# Patient Record
Sex: Female | Born: 1970 | Race: White | Hispanic: No | Marital: Married | Smoking: Never smoker
Health system: Southern US, Community
[De-identification: ages and names within clinical notes are randomized; demographics above are authoritative.]

## PROBLEM LIST (undated history)

## (undated) DIAGNOSIS — N840 Polyp of corpus uteri: Secondary | ICD-10-CM

## (undated) DIAGNOSIS — D259 Leiomyoma of uterus, unspecified: Secondary | ICD-10-CM

## (undated) DIAGNOSIS — C73 Malignant neoplasm of thyroid gland: Secondary | ICD-10-CM

## (undated) DIAGNOSIS — C50919 Malignant neoplasm of unspecified site of unspecified female breast: Secondary | ICD-10-CM

## (undated) DIAGNOSIS — F329 Major depressive disorder, single episode, unspecified: Secondary | ICD-10-CM

## (undated) DIAGNOSIS — R32 Unspecified urinary incontinence: Secondary | ICD-10-CM

## (undated) DIAGNOSIS — F32A Depression, unspecified: Secondary | ICD-10-CM

## (undated) DIAGNOSIS — N979 Female infertility, unspecified: Secondary | ICD-10-CM

## (undated) DIAGNOSIS — Z87442 Personal history of urinary calculi: Secondary | ICD-10-CM

## (undated) DIAGNOSIS — C801 Malignant (primary) neoplasm, unspecified: Secondary | ICD-10-CM

## (undated) DIAGNOSIS — M1990 Unspecified osteoarthritis, unspecified site: Secondary | ICD-10-CM

## (undated) DIAGNOSIS — F419 Anxiety disorder, unspecified: Secondary | ICD-10-CM

## (undated) HISTORY — DX: Polyp of corpus uteri: N84.0

## (undated) HISTORY — DX: Leiomyoma of uterus, unspecified: D25.9

## (undated) HISTORY — DX: Malignant neoplasm of unspecified site of unspecified female breast: C50.919

## (undated) HISTORY — DX: Unspecified urinary incontinence: R32

## (undated) HISTORY — DX: Malignant neoplasm of thyroid gland: C73

## (undated) HISTORY — DX: Malignant (primary) neoplasm, unspecified: C80.1

## (undated) HISTORY — DX: Anxiety disorder, unspecified: F41.9

## (undated) HISTORY — DX: Female infertility, unspecified: N97.9

## (undated) HISTORY — DX: Major depressive disorder, single episode, unspecified: F32.9

## (undated) HISTORY — PX: WISDOM TOOTH EXTRACTION: SHX21

## (undated) HISTORY — DX: Depression, unspecified: F32.A

---

## 2004-01-02 ENCOUNTER — Other Ambulatory Visit: Admission: RE | Admit: 2004-01-02 | Discharge: 2004-01-02 | Payer: Self-pay | Admitting: Obstetrics and Gynecology

## 2004-06-22 ENCOUNTER — Inpatient Hospital Stay (HOSPITAL_COMMUNITY): Admission: AD | Admit: 2004-06-22 | Discharge: 2004-06-22 | Payer: Self-pay | Admitting: Obstetrics and Gynecology

## 2004-09-09 ENCOUNTER — Inpatient Hospital Stay (HOSPITAL_COMMUNITY): Admission: AD | Admit: 2004-09-09 | Discharge: 2004-09-11 | Payer: Self-pay | Admitting: Obstetrics and Gynecology

## 2004-09-14 ENCOUNTER — Encounter: Admission: RE | Admit: 2004-09-14 | Discharge: 2004-09-23 | Payer: Self-pay | Admitting: Obstetrics and Gynecology

## 2005-04-21 ENCOUNTER — Other Ambulatory Visit: Admission: RE | Admit: 2005-04-21 | Discharge: 2005-04-21 | Payer: Self-pay | Admitting: Obstetrics and Gynecology

## 2011-06-08 DIAGNOSIS — R112 Nausea with vomiting, unspecified: Secondary | ICD-10-CM

## 2011-06-08 DIAGNOSIS — Z9889 Other specified postprocedural states: Secondary | ICD-10-CM

## 2011-06-08 DIAGNOSIS — C73 Malignant neoplasm of thyroid gland: Secondary | ICD-10-CM

## 2011-06-08 HISTORY — DX: Nausea with vomiting, unspecified: R11.2

## 2011-06-08 HISTORY — PX: TOTAL THYROIDECTOMY: SHX2547

## 2011-06-08 HISTORY — DX: Malignant neoplasm of thyroid gland: C73

## 2011-06-08 HISTORY — DX: Other specified postprocedural states: Z98.890

## 2015-11-18 DIAGNOSIS — Z85850 Personal history of malignant neoplasm of thyroid: Secondary | ICD-10-CM | POA: Diagnosis not present

## 2015-11-21 DIAGNOSIS — Z85850 Personal history of malignant neoplasm of thyroid: Secondary | ICD-10-CM | POA: Diagnosis not present

## 2016-01-13 ENCOUNTER — Telehealth: Payer: Self-pay | Admitting: Obstetrics & Gynecology

## 2016-01-13 ENCOUNTER — Encounter: Payer: Self-pay | Admitting: Obstetrics & Gynecology

## 2016-01-13 ENCOUNTER — Ambulatory Visit (INDEPENDENT_AMBULATORY_CARE_PROVIDER_SITE_OTHER): Payer: BLUE CROSS/BLUE SHIELD | Admitting: Obstetrics & Gynecology

## 2016-01-13 VITALS — BP 106/70 | HR 66 | Resp 14 | Ht 69.0 in | Wt 198.0 lb

## 2016-01-13 DIAGNOSIS — Z01419 Encounter for gynecological examination (general) (routine) without abnormal findings: Secondary | ICD-10-CM

## 2016-01-13 DIAGNOSIS — Z124 Encounter for screening for malignant neoplasm of cervix: Secondary | ICD-10-CM

## 2016-01-13 DIAGNOSIS — C73 Malignant neoplasm of thyroid gland: Secondary | ICD-10-CM | POA: Diagnosis not present

## 2016-01-13 DIAGNOSIS — N951 Menopausal and female climacteric states: Secondary | ICD-10-CM | POA: Diagnosis not present

## 2016-01-13 DIAGNOSIS — Z1151 Encounter for screening for human papillomavirus (HPV): Secondary | ICD-10-CM | POA: Diagnosis not present

## 2016-01-13 DIAGNOSIS — E890 Postprocedural hypothyroidism: Secondary | ICD-10-CM

## 2016-01-13 DIAGNOSIS — N840 Polyp of corpus uteri: Secondary | ICD-10-CM

## 2016-01-13 NOTE — Telephone Encounter (Signed)
Patient would like to find out what her insurance will cover for an ablation procedure.

## 2016-01-13 NOTE — Progress Notes (Signed)
45 y.o. G1P1 Married Caucasian F here for new patient appt.  She was referred from Dr. Virgina Jock.  Pt grew up in [place] but was living in [place].  Reports over the last few months her cycles have changed.  Her flow starts as brownish discharge for a few days and then her flow starts.  Flow lasts 3 days with the first two heavy.  Cycles have been this way for a long time.  Gradually they have gotten heavier.    Pt was back in [place] visiting extended family and saw her PCP there and had ultrasound done.  This showed 93mm endometrium, normal ovaries except for a normal appearing follicle, and a 6 x 45mm polyp.  (Pt brought a copy of the DVD and I reviewed it personally.)  Pt referred, first, to see if polyp removal is needed.  Second, pt needs to have pap updated.  Last one was normal but was in 2015.  Pt was able to call office in [place] and was advised she did not have HR HPV testing done at that visit.  She signed release for me to get a copy of her records.  Lastly, pt reports she is feeling "more hormonal" over the last few months and feels "off".  She reports weight gain over the last year.  She does report the stress of moving from [place] back to [place] has been significant.  However, she is glad she is here and would like to start focusing on her health.    Hx of infertility when trying for second pregnancy.  Never was able to get pregnant.  Thyroid cancer was discovered during this time.  She has had a complete thyroidectomy with some lymph nodes removed.  On thyroid replacement.  She is followed by Dr. Virgina Jock for this.  Brought copies of recent thyroid testing which was normal.   Patient's last menstrual period was 01/02/2016 (exact date).          Sexually active: Yes.    The current method of family planning is coitus interruptus.    Exercising: Yes.    walking, swimming  Smoker:  no  Health Maintenance: Pap:  2015 normal per patient  History of abnormal Pap:  no MMG:  Fall 2016, normal per  patient, pt has copy she will bring Colonoscopy:  never BMD:   never TDaP:  unsure Pneumonia vaccine(s):  never Zostavax:   never Hep C testing: not indicated  Screening Labs: PCP- has paper copy with her, Hb today: PCP, Urine today: PCP   Past Medical History:  Diagnosis Date  . Anxiety   . Depression   . Infertility, female   . Thyroid cancer ([place]) 2013  . Urinary incontinence     Past Surgical History:  Procedure Laterality Date  . TOTAL THYROIDECTOMY  2013    Current Outpatient Prescriptions  Medication Sig Dispense Refill  . amphetamine-dextroamphetamine (ADDERALL) 5 MG tablet TK 1 T PO D  0  . escitalopram (LEXAPRO) 10 MG tablet Take 10 mg by mouth daily.    Marland Kitchen levothyroxine (SYNTHROID, LEVOTHROID) 137 MCG tablet TK 1 T PO 5 TIMES A WEEK  3  . levothyroxine (SYNTHROID, LEVOTHROID) 150 MCG tablet Take 150 mcg by mouth once a week. Thursday    . NON FORMULARY [place] 40 + vitamin     No current facility-administered medications for this visit.     History reviewed. No pertinent family history.  ROS:  Pertinent items are noted in HPI.  Otherwise,  a comprehensive ROS was negative.  Exam:   BP 106/70 (BP Location: Right Arm, Patient Position: Sitting)   Pulse 66   Resp 14   Ht 5\' 9"  (1.753 m)   Wt 198 lb (89.8 kg)   LMP 01/02/2016 (Exact Date)   BMI 29.24 kg/m     Height: 5\' 9"  (175.3 cm)  Ht Readings from Last 3 Encounters:  01/13/16 5\' 9"  (1.753 m)    General appearance: alert, cooperative and appears stated age Head: Normocephalic, without obvious abnormality, atraumatic Neck: no adenopathy, supple, symmetrical, trachea midline and thyroid normal to inspection and palpation Lungs: clear to auscultation bilaterally Breasts: normal appearance, no masses or tenderness Heart: regular rate and rhythm Abdomen: soft, non-tender; bowel sounds normal; no masses,  no organomegaly Extremities: extremities normal, atraumatic, no cyanosis or edema Skin: Skin  color, texture, turgor normal. No rashes or lesions Lymph nodes: Cervical, supraclavicular, and axillary nodes normal. No abnormal inguinal nodes palpated Neurologic: Grossly normal   Pelvic: External genitalia:  no lesions              Urethra:  normal appearing urethra with no masses, tenderness or lesions              Bartholins and Skenes: normal                 Vagina: normal appearing vagina with normal color and discharge, no lesions              Cervix: no lesions              Pap taken: Yes.   Bimanual Exam:  Uterus:  normal size, contour, position, consistency, mobility, non-tender              Adnexa: normal adnexa and no mass, fullness, tenderness               Rectovaginal: Confirms               Anus:  normal sphincter tone, no lesions  Chaperone was present for exam.  A:  Well Woman with normal exam Change in cycles with dark spotting for a few days before cycles Long standing menorrhagia during first two days of cycles 6 x 34mm endometrial polyp noted on PUS done in [place] H/O thyroidectomy due to thyroid cancer, on thyroid replacement  P:   Mammogram screening discussed.  information for starting MMG in [place] given Pap and HR HPV obtained today. Will have pt return for day 3 FSH and estradiol to assess ovarian reserve Reviewed indications for removal of polyp--size >1.5cm, risks for hyperplasia/malignancy, multiple polyps, polyp prolapsed through cervix (none of which exist in this case).  This seems to be an incidental finding.  However, treatment for her bleeding may be appropriate.  Endometrial biopsy and possible endometrial ablation with concurrent resection of polyp could be done.  Pt is concerned about cost.  Also, discussed with pt treatment with low dosed OCPs.  Progesterone options discussed as well.  Need for contraception and risks for future pregnancy if was pregnant after an ablation reviewed as well.  Pt would like to start with hormonal testing and take a  "step-wise approach".  She will call with onset of cycle for blood work.  Information on ablation given.  ~ In addition to new pt AEX, 30 minutes spent with patient in face to face discussion regarding ultrasound, indications for polyp resection, possible other treatment options for bleeding.    1

## 2016-01-14 DIAGNOSIS — E039 Hypothyroidism, unspecified: Secondary | ICD-10-CM | POA: Insufficient documentation

## 2016-01-14 DIAGNOSIS — N840 Polyp of corpus uteri: Secondary | ICD-10-CM

## 2016-01-14 DIAGNOSIS — C73 Malignant neoplasm of thyroid gland: Secondary | ICD-10-CM | POA: Insufficient documentation

## 2016-01-14 HISTORY — DX: Polyp of corpus uteri: N84.0

## 2016-01-16 LAB — IPS PAP TEST WITH HPV

## 2016-01-26 NOTE — Telephone Encounter (Signed)
Patient is calling to schedule an appointment with dr Sabra Heck but is not sure what she needs to be seen for.

## 2016-01-26 NOTE — Telephone Encounter (Signed)
Return call to patient. States she would like to schedule Day 3 blood tests she discussed with Dr Sabra Heck. Today is first day of full cycle bleeding, had some spotting prior to this but this is first day of actual bleeding. Labs scheduled for 01-28-16 as patient states she discussed with Dr Sabra Heck.  Routing to provider for final review. Patient agreeable to disposition. Will close encounter.

## 2016-01-28 ENCOUNTER — Other Ambulatory Visit (INDEPENDENT_AMBULATORY_CARE_PROVIDER_SITE_OTHER): Payer: BLUE CROSS/BLUE SHIELD

## 2016-01-28 DIAGNOSIS — N951 Menopausal and female climacteric states: Secondary | ICD-10-CM

## 2016-01-29 LAB — FOLLICLE STIMULATING HORMONE: FSH: 9.5 m[IU]/mL

## 2016-01-29 LAB — ESTRADIOL: Estradiol: 21 pg/mL

## 2016-01-30 ENCOUNTER — Encounter: Payer: Self-pay | Admitting: Obstetrics & Gynecology

## 2016-02-03 NOTE — Telephone Encounter (Signed)
Lamont Snowball, RN spoke with patient regarding results. Please see result note dated 01/28/2016.

## 2016-02-11 ENCOUNTER — Telehealth: Payer: Self-pay | Admitting: *Deleted

## 2016-02-11 MED ORDER — NORETHINDRONE ACET-ETHINYL EST 1-20 MG-MCG PO TABS: 1.0000 | ORAL_TABLET | Freq: Every day | ORAL | 3 refills | Status: DC

## 2016-02-11 NOTE — Telephone Encounter (Signed)
Reviewed lab results with Dr Sabra Heck. Dr Sabra Heck recommends 3 month trial of Lo Estrin 1/20 and then follow up visit. Patient agreeable to plan. Brief review of OCP and possible side effects. Discussed taking at same time daily and start on first day of cycle due in next week or so. Follow- up visit scheduled for 04-23-16 with Dr Sabra Heck.

## 2016-03-01 DIAGNOSIS — G8929 Other chronic pain: Secondary | ICD-10-CM | POA: Diagnosis not present

## 2016-03-01 DIAGNOSIS — M25572 Pain in left ankle and joints of left foot: Secondary | ICD-10-CM | POA: Diagnosis not present

## 2016-03-01 DIAGNOSIS — R600 Localized edema: Secondary | ICD-10-CM | POA: Diagnosis not present

## 2016-03-04 ENCOUNTER — Telehealth: Payer: Self-pay | Admitting: Obstetrics & Gynecology

## 2016-03-04 NOTE — Telephone Encounter (Signed)
Patient is having issues with her birth control and would like to speak with nurse about switching.

## 2016-03-04 NOTE — Telephone Encounter (Signed)
Spoke with patient. Patient reports she started taking  Lo Estrin 1/20, 2 weeks ago. Patient states she does not like the way she feels. Patient reports she feels like she is "going to jump out of her skin", increased aggression, "mean", "not myself" and "just don't feel myself". Patient states this is only new med she is taking. Reviewed recommendations of 3 month trial with OCP and side effects. Patient reports missing doses and not not taking at same time everyday. Patient reports "cycles have improved". Patient would like to stop the medication and switch to another. Recommended OV with Dr. Sabra Heck to discuss options, patient declined. Patient states this is a busy time for her and her work, she will keep current follow-up 04/23/16. Advised will review with Dr. Sabra Heck for recommendations and return call. Advised Dr. Sabra Heck is seeing patients, response may not be immediate. Patient is agreeable. Last AEX 01/13/16.   Dr. Sabra Heck, please advise?

## 2016-03-04 NOTE — Telephone Encounter (Signed)
Please have her stop the OCPs to make sure symptoms resolve.  Once they do, she can let us know and start Loloestrin.  This one may cost more but is the only estrogen containing pill that is lower dosed than the Loestrin 1/20.  I would have her call with onset of her next cycle to start the new pill.  She will want to get on line and print a coupon for the [facility] as it is sometimes more expensive than the first one she used.

## 2016-03-04 NOTE — Telephone Encounter (Signed)
Left detailed message on [phone] per current ROI on file. Advised as seen below per Dr. Sabra Heck. Advised patient to call with any additional questions.  Routing to provider for final review. Patient is agreeable to disposition. Will close encounter.

## 2016-04-05 ENCOUNTER — Encounter: Payer: Self-pay | Admitting: Obstetrics & Gynecology

## 2016-04-05 ENCOUNTER — Telehealth: Payer: Self-pay | Admitting: Obstetrics & Gynecology

## 2016-04-05 NOTE — Telephone Encounter (Signed)
Patient is trying to get her results and needs to speak with the nurse.

## 2016-04-05 NOTE — Telephone Encounter (Signed)
Spoke with patient. Patient states results that she had requested had already been faxed, no further assistance needed. Thankful for calling back.   Routing to provider for final review. Patient is agreeable to disposition. Will close encounter.

## 2016-04-07 DIAGNOSIS — R635 Abnormal weight gain: Secondary | ICD-10-CM | POA: Diagnosis not present

## 2016-04-07 DIAGNOSIS — E559 Vitamin D deficiency, unspecified: Secondary | ICD-10-CM | POA: Diagnosis not present

## 2016-04-07 DIAGNOSIS — F419 Anxiety disorder, unspecified: Secondary | ICD-10-CM | POA: Diagnosis not present

## 2016-04-07 DIAGNOSIS — E039 Hypothyroidism, unspecified: Secondary | ICD-10-CM | POA: Diagnosis not present

## 2016-04-07 DIAGNOSIS — F909 Attention-deficit hyperactivity disorder, unspecified type: Secondary | ICD-10-CM | POA: Diagnosis not present

## 2016-04-09 DIAGNOSIS — Z0000 Encounter for general adult medical examination without abnormal findings: Secondary | ICD-10-CM | POA: Diagnosis not present

## 2016-04-09 DIAGNOSIS — E038 Other specified hypothyroidism: Secondary | ICD-10-CM | POA: Diagnosis not present

## 2016-04-20 DIAGNOSIS — Z0000 Encounter for general adult medical examination without abnormal findings: Secondary | ICD-10-CM | POA: Diagnosis not present

## 2016-04-20 DIAGNOSIS — F900 Attention-deficit hyperactivity disorder, predominantly inattentive type: Secondary | ICD-10-CM | POA: Diagnosis not present

## 2016-04-20 DIAGNOSIS — Z6829 Body mass index (BMI) 29.0-29.9, adult: Secondary | ICD-10-CM | POA: Diagnosis not present

## 2016-04-20 DIAGNOSIS — F418 Other specified anxiety disorders: Secondary | ICD-10-CM | POA: Diagnosis not present

## 2016-04-20 DIAGNOSIS — Z1389 Encounter for screening for other disorder: Secondary | ICD-10-CM | POA: Diagnosis not present

## 2016-04-20 DIAGNOSIS — N946 Dysmenorrhea, unspecified: Secondary | ICD-10-CM | POA: Diagnosis not present

## 2016-04-20 DIAGNOSIS — N840 Polyp of corpus uteri: Secondary | ICD-10-CM | POA: Diagnosis not present

## 2016-04-23 ENCOUNTER — Ambulatory Visit (INDEPENDENT_AMBULATORY_CARE_PROVIDER_SITE_OTHER): Payer: BLUE CROSS/BLUE SHIELD | Admitting: Obstetrics & Gynecology

## 2016-04-23 ENCOUNTER — Encounter: Payer: Self-pay | Admitting: Obstetrics & Gynecology

## 2016-04-23 VITALS — BP 122/70 | HR 68 | Resp 16 | Ht 68.0 in | Wt 194.0 lb

## 2016-04-23 DIAGNOSIS — N920 Excessive and frequent menstruation with regular cycle: Secondary | ICD-10-CM | POA: Diagnosis not present

## 2016-04-23 DIAGNOSIS — Z3044 Encounter for surveillance of vaginal ring hormonal contraceptive device: Secondary | ICD-10-CM | POA: Diagnosis not present

## 2016-04-23 DIAGNOSIS — F411 Generalized anxiety disorder: Secondary | ICD-10-CM

## 2016-04-23 NOTE — Progress Notes (Signed)
GYNECOLOGY  VISIT   HPI:  45 y.o. G1P1 Married Caucasian female here for follow of bleeding and mood issues that she feels is very hormonal in nature.  Having increased anxiety that is causing some work issues.  She really does not want to be on any medication for this.  We did discuss SSRI use and not using benzodiazepines if she does decide to start treatment.  Pt feels if cycles were better, this would help.  Reports cycles are regular and have decreased in length with the last two cycles.  She is still interested in "having something done" about the bleeding because prior to the two months, she's had very heavy bleeding at times.  She did have an ultrasound with a very small polyp present.  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  She had long term infertility.  We discussed endometrial ablation but I would recommend some procedure for permanent sterilization if this were to be done.  She was considering going back to [place] to have it done but this hasn't happened.  She did go to [facility] in [place].  Was advised regarding several options which she didn't really feel comfortable with so hasn't started anything.  Really doesn't want to be on medication.  Before her visit, we discussed visit on the phone.  I requested she have a repeat [place] and estradiol at that visit.  She brings her labs with her and that was not done.  Everything else was normal.  She is considering trying Armour Thyroid vs levothyroxine.  This was suggested by integrative provider.  She wants to discuss.  My limited experience and concerns with this medication discussed.  We also discussed IUD use today for control of bleeding.  Depending on coverage, this could be quite low cost.  She is interested in "doing something" so would like additional information.  Procedure, risks and benefits reviewed.     Patient's last menstrual period was 04/02/2016.     GYNECOLOGIC  HISTORY: Patient's last menstrual period was 04/02/2016. Contraception: infertility, no active use of contraception Menopausal hormone therapy: none  Patient Active Problem List   Diagnosis Date Noted  . Endometrial polyp 01/14/2016  . Thyroid cancer ([place]) 01/14/2016  . Hypothyroidism 01/14/2016    Past Medical History:  Diagnosis Date  . Anxiety   . Depression   . Infertility, female   . Thyroid cancer ([place]) 2013  . Urinary incontinence     Past Surgical History:  Procedure Laterality Date  . TOTAL THYROIDECTOMY  2013    MEDS:  Reviewed in EPIC and UTD  ALLERGIES: Erythromycin  Family History  Problem Relation Age of Onset  . Breast cancer Mother 66    Double Mastectomy   . Breast cancer Other     maternal great aunt    SH:  Married, non smoker  Review of Systems  Psychiatric/Behavioral: The patient is nervous/anxious.   All other systems reviewed and are negative.   PHYSICAL EXAMINATION:    BP 122/70 (BP Location: Right Arm, Patient Position: Sitting, Cuff Size: Normal)   Pulse 68   Resp 16   Ht 5\' 8"  (1.727 m)   Wt 194 lb (88 kg)   LMP 04/02/2016   BMI 29.50 kg/m     General appearance: alert, cooperative and appears stated age No other exam was performed  Assessment: H/O menorrhagia that pt feels is due to "hormonal issues".  Has seen integrative medicine provider.  Small endometrial polyp Anxiety  Plan: Declined medical treatment for her anxiety at this time Desires to have more information about cost with IUD placement.  She will be contacted with this information.   ~35 minutes spent with patient with all of this in face to face discussion of above.  Lengthy visit.

## 2016-04-27 ENCOUNTER — Telehealth: Payer: Self-pay | Admitting: Obstetrics & Gynecology

## 2016-04-27 NOTE — Telephone Encounter (Signed)
Spoke with patient in regards to benefits for a Mirena IUD. Patient understood and is agreeable. Advised patient to call our office at the onset of her cycle in December, for placement. Patient is agreeable. Patient has no further questions.  Routing to Dr Sabra Heck

## 2016-05-11 DIAGNOSIS — R635 Abnormal weight gain: Secondary | ICD-10-CM | POA: Diagnosis not present

## 2016-05-11 DIAGNOSIS — Z85850 Personal history of malignant neoplasm of thyroid: Secondary | ICD-10-CM | POA: Diagnosis not present

## 2016-05-11 DIAGNOSIS — M2550 Pain in unspecified joint: Secondary | ICD-10-CM | POA: Diagnosis not present

## 2016-05-11 DIAGNOSIS — F419 Anxiety disorder, unspecified: Secondary | ICD-10-CM | POA: Diagnosis not present

## 2016-05-20 DIAGNOSIS — Z1212 Encounter for screening for malignant neoplasm of rectum: Secondary | ICD-10-CM | POA: Diagnosis not present

## 2016-07-05 ENCOUNTER — Encounter: Payer: Self-pay | Admitting: Obstetrics & Gynecology

## 2016-07-09 DIAGNOSIS — Z85850 Personal history of malignant neoplasm of thyroid: Secondary | ICD-10-CM | POA: Diagnosis not present

## 2016-07-09 DIAGNOSIS — F419 Anxiety disorder, unspecified: Secondary | ICD-10-CM | POA: Diagnosis not present

## 2016-07-09 DIAGNOSIS — R635 Abnormal weight gain: Secondary | ICD-10-CM | POA: Diagnosis not present

## 2016-07-09 DIAGNOSIS — M2550 Pain in unspecified joint: Secondary | ICD-10-CM | POA: Diagnosis not present

## 2016-08-30 DIAGNOSIS — E890 Postprocedural hypothyroidism: Secondary | ICD-10-CM | POA: Insufficient documentation

## 2016-08-30 DIAGNOSIS — Z043 Encounter for examination and observation following other accident: Secondary | ICD-10-CM | POA: Diagnosis not present

## 2016-08-30 DIAGNOSIS — Z6829 Body mass index (BMI) 29.0-29.9, adult: Secondary | ICD-10-CM | POA: Diagnosis not present

## 2016-08-30 DIAGNOSIS — F988 Other specified behavioral and emotional disorders with onset usually occurring in childhood and adolescence: Secondary | ICD-10-CM | POA: Insufficient documentation

## 2016-08-30 DIAGNOSIS — S199XXA Unspecified injury of neck, initial encounter: Secondary | ICD-10-CM | POA: Diagnosis not present

## 2016-08-30 DIAGNOSIS — S134XXA Sprain of ligaments of cervical spine, initial encounter: Secondary | ICD-10-CM | POA: Diagnosis not present

## 2016-08-30 DIAGNOSIS — S335XXA Sprain of ligaments of lumbar spine, initial encounter: Secondary | ICD-10-CM | POA: Diagnosis not present

## 2016-08-30 DIAGNOSIS — M545 Low back pain: Secondary | ICD-10-CM | POA: Diagnosis not present

## 2016-10-15 DIAGNOSIS — E039 Hypothyroidism, unspecified: Secondary | ICD-10-CM | POA: Diagnosis not present

## 2016-10-15 DIAGNOSIS — F909 Attention-deficit hyperactivity disorder, unspecified type: Secondary | ICD-10-CM | POA: Diagnosis not present

## 2016-10-15 DIAGNOSIS — F419 Anxiety disorder, unspecified: Secondary | ICD-10-CM | POA: Diagnosis not present

## 2016-10-15 DIAGNOSIS — M2550 Pain in unspecified joint: Secondary | ICD-10-CM | POA: Diagnosis not present

## 2016-10-19 DIAGNOSIS — F900 Attention-deficit hyperactivity disorder, predominantly inattentive type: Secondary | ICD-10-CM | POA: Diagnosis not present

## 2016-10-19 DIAGNOSIS — M5489 Other dorsalgia: Secondary | ICD-10-CM | POA: Diagnosis not present

## 2016-10-19 DIAGNOSIS — F418 Other specified anxiety disorders: Secondary | ICD-10-CM | POA: Diagnosis not present

## 2016-10-19 DIAGNOSIS — E663 Overweight: Secondary | ICD-10-CM | POA: Diagnosis not present

## 2016-10-29 DIAGNOSIS — M2550 Pain in unspecified joint: Secondary | ICD-10-CM | POA: Diagnosis not present

## 2016-10-29 DIAGNOSIS — F419 Anxiety disorder, unspecified: Secondary | ICD-10-CM | POA: Diagnosis not present

## 2016-10-29 DIAGNOSIS — R635 Abnormal weight gain: Secondary | ICD-10-CM | POA: Diagnosis not present

## 2016-10-29 DIAGNOSIS — Z85850 Personal history of malignant neoplasm of thyroid: Secondary | ICD-10-CM | POA: Diagnosis not present

## 2016-12-23 ENCOUNTER — Encounter: Payer: Self-pay | Admitting: Obstetrics & Gynecology

## 2016-12-28 ENCOUNTER — Telehealth: Payer: Self-pay

## 2016-12-28 NOTE — Telephone Encounter (Signed)
Routed to [facility] for review of referral to [facility].

## 2016-12-28 NOTE — Telephone Encounter (Signed)
From [name] To [name], MD Sent 12/23/2016 10:58 AM  Hello,   Just touching base . I was recently in [place], [place] where I still have health coverage. I went to the [facility] and had a followup ultrasound to check in on the polyp found a year prior. Also met with my endocronologist and determined that the Pig Thyroid I had been taking for about 2 months was making me extremely hypothroid. I immediately switched back to synthroid and am working on getting those levels back to normal. I'm searching for a good endocronologist in the [facility] area if your office has any referrals. Also am asking Dr. Virgina Jock for a referral. I have the ultrasound report and bloodwork that I will try to attach to this email or can send in a [facility] email if you can provide me an address.  Attached is the bloodwork taken last week as well as the ultrasound done and the original one done a year ago.    Renee [name], Renee [name]  [phone]   Attachments           [id number]    Responsible Party   Pool - [facility] Clinical Pool No one has taken responsibility for this message.  No actions have been taken on this message.   Dr.Miller, okay to refer this patient to [facility]?

## 2016-12-29 ENCOUNTER — Other Ambulatory Visit: Payer: Self-pay | Admitting: Obstetrics & Gynecology

## 2016-12-29 DIAGNOSIS — E039 Hypothyroidism, unspecified: Secondary | ICD-10-CM

## 2016-12-29 NOTE — Telephone Encounter (Signed)
I'd rather refer to Dr. Buddy Duty at [facility].  It's just so hard getting notes from Dr. Chalmers Cater and her office.  Referral has been entered.  Thanks.

## 2016-12-29 NOTE — Telephone Encounter (Signed)
Left message to call Kaitlyn at [phone]. 

## 2016-12-31 NOTE — Telephone Encounter (Signed)
Routing to provider for final review. Will close encounter.     

## 2017-01-04 NOTE — Telephone Encounter (Signed)
Placed call to patient to review benefits for recommended surgical procedure and to update patient on status of referral to Dr Cindra Eves office.  Advised patient I have spoken with TJ in Dr Cindra Eves office. She advised Dr Buddy Duty reviews all requested referrals, once he has reviewed, then appointment will be scheduled. TJ advised the first available appointment with Dr Buddy Duty will be the end of November. Patient states she is "ok with waiting until November".  Patient states in the interim, she will follow up with her PCP, [facility]on [facility]. She has an appointment next week for blood work.  I also reviewed benefits for recommended hysteroscopy and IUD insertion. Patient understood information presented, but states she may want to schedule an appointment to see Dr Sabra Heck, to review other options before deciding how to proceed.  Patient states she has been communicating with our Nurse Supervisor, Lamont Snowball, RN and ask if she could return a call to her. Patient states she has many questions and will make a list to discuss with the nurse, upon return call.  Routing to Lorella Nimrod, RN  cc: Dr Sabra Heck

## 2017-01-05 NOTE — Telephone Encounter (Signed)
My Chart message from patient:  Hi Marinda Elk called me back today and went over the medical benefits with me. I have several questions.    1. Would Dr. Sabra Heck recommend I do the IUD in the office or in patient?  2. I'm concerned about the cost of removing the polyp now. I'm wondering since I dont have any symptoms if we could go ahead and do the IUD now in the office and keep an eye on the polyp. If I have it checked again next year and it is still growing could I go ahead with the surgery when the IUD is already in place?  3. When was my last visit to the office and when am I due for another visit/checkup?    Thanks so much,  Water quality scientist  ----- Message -----  From: Nurse Gilles Chiquito  Sent: 01/03/2017 6:41 PM EDT  To: Last Name Sir-Noi-Vich H. Haluska  Subject: (No subject)  I can see that it has been entered and sent to his office. I will have the business office check on that appointment as well.

## 2017-01-05 NOTE — Telephone Encounter (Signed)
Call to patient regarding My Chart message. Left message to call back and ask for triage.

## 2017-01-05 NOTE — Telephone Encounter (Signed)
Ok to just make appt for pt.

## 2017-01-06 ENCOUNTER — Telehealth: Payer: Self-pay | Admitting: Obstetrics & Gynecology

## 2017-01-06 NOTE — Telephone Encounter (Signed)
Call transferred from Triage Nurse, Glorianne Manchester, RN. Patient requested to review benefit information again for recommended surgery. Reviewed benefits and answered benefit questions. Patient understood information presented. Patient is aware this is the professional benefit only . Patient asked how she could obtain benefit information for hospital services. I provided patient with the phone number to the [facility] [phone] to contact and address her question for the hospital services. Patient advises she will contact the [facility].  Routing to Dr Sabra Heck  cc: Lamont Snowball  cc: Glorianne Manchester, RN

## 2017-01-06 NOTE — Telephone Encounter (Signed)
See previous telephone encounter dated 12/28/16. Will close encounter.

## 2017-01-06 NOTE — Telephone Encounter (Signed)
Spoke with patient. Patient returned call in regards to MyChart message as seen below. Patient states polyp has grown to 10 mm, would it be ok to watch and not do surgery right now d/t cost of surgery?  Wants to make the right decision, Mirena is covered. Risk of waiting on surgery until after IUD placed?   Recommended OV with Dr. Sabra Heck for further discussion. Advised last OV 04/23/16, pap 01/13/16 with Dr. Sabra Heck. Patient is agreeable, request week of Aug 20th. Patient scheduled for OV on 01/25/17 at 2:30pm. Advised patient Dr. Sabra Heck will review, will return call with any additional recommendations.   Routing to provider for final review. Patient is agreeable to disposition. Will close encounter.

## 2017-01-06 NOTE — Telephone Encounter (Signed)
Spoke with patient, advised as seen below per Dr. Sabra Heck. Patient has additional questions regarding her history of anesthesia and reaction, inpatient vs outpatient and cost. Advised patient procedure is outpatient, home the same day. Advised patient to discuss anesthesia concerns with Dr. Sabra Heck at [facility]. Would forward call to [facility] for review of benefits again. Patient verbalizes understanding and is agreeable.  Call forwarded to [facility]  Routing to provider for final review. Patient is agreeable to disposition. Will close encounter.

## 2017-01-06 NOTE — Telephone Encounter (Signed)
Will you let her know typically with IUD and a polyp in place, there is continued spotting which women find really annoying.  Also, the IUD has to be removed to remove the polyp, so if that is done later, then one needs a new IUD placed.  Typically, the polyp is removed and then the IUD is placed.  Can be done at the same time if can get the IUD covered through specialty pharmacy to take to the OR with [place] day of procedure.  Will review with pt at [facility] as well.  Thanks.

## 2017-01-07 ENCOUNTER — Telehealth: Payer: Self-pay | Admitting: Endocrinology

## 2017-01-07 NOTE — Telephone Encounter (Signed)
Routing to you °

## 2017-01-07 NOTE — Telephone Encounter (Signed)
[facility] calling in reference to patient needing an appointment for hypothyroidism. [facility] stated they sent records in on 12/23/16 and said it was in review on 12/27/16 Please call and advise.

## 2017-01-10 NOTE — Telephone Encounter (Signed)
Can you look into this one? I do not remember seeing this information and not sure where the patient notes packet is with her information. Thanks!

## 2017-01-10 NOTE — Telephone Encounter (Signed)
I do not have any pending records

## 2017-01-14 DIAGNOSIS — E039 Hypothyroidism, unspecified: Secondary | ICD-10-CM | POA: Diagnosis not present

## 2017-01-14 DIAGNOSIS — E038 Other specified hypothyroidism: Secondary | ICD-10-CM | POA: Diagnosis not present

## 2017-01-14 NOTE — Telephone Encounter (Signed)
Kenney Houseman from [facility] called in reference to faxing records. I informed her of notes below. [employer] fax number [phone].

## 2017-01-14 NOTE — Telephone Encounter (Signed)
Papers are coming thru the fax now

## 2017-01-25 ENCOUNTER — Encounter: Payer: Self-pay | Admitting: Obstetrics & Gynecology

## 2017-01-25 ENCOUNTER — Ambulatory Visit (INDEPENDENT_AMBULATORY_CARE_PROVIDER_SITE_OTHER): Payer: BLUE CROSS/BLUE SHIELD | Admitting: Obstetrics & Gynecology

## 2017-01-25 VITALS — BP 108/70 | HR 70 | Resp 14 | Ht 68.5 in | Wt 201.0 lb

## 2017-01-25 DIAGNOSIS — N920 Excessive and frequent menstruation with regular cycle: Secondary | ICD-10-CM

## 2017-01-25 DIAGNOSIS — N840 Polyp of corpus uteri: Secondary | ICD-10-CM

## 2017-01-25 NOTE — Progress Notes (Signed)
GYNECOLOGY  VISIT   HPI: 46 y.o. G1P1 Married Caucasian female here for discussion of enlarging endometrial polyp.  She has ultrasound done in [place] and sent report to me.  Polyp is now >1cm.  She has menorrhagia and also would like IUD placement.  Have previously recommended hysteroscopy with polyp resection.  Pt is wondering what will happen if she just has the Mirena IUD placed.  Advised she will likely have prolonged spotting.  That has been my experience with other women and IUD placement when a polyp or fibroid is present.  Advised we could proceed but that would not be my recommendation.  She felt this way too but states she just needed it confirmed.  Is going to try and do the procedure in [place] and then return for IUD placement.  Procedure was reviewed as well as risks, benefits, and recovery.  Questions answered.    GYNECOLOGIC HISTORY: Patient's last menstrual period was 01/20/2017. Contraception: withdrawal method Menopausal hormone therapy: none  Patient Active Problem List   Diagnosis Date Noted  . Endometrial polyp 01/14/2016  . Thyroid cancer ([place]) 01/14/2016  . Hypothyroidism 01/14/2016    Past Medical History:  Diagnosis Date  . Anxiety   . Depression   . Infertility, female   . Thyroid cancer ([place]) 2013  . Urinary incontinence     Past Surgical History:  Procedure Laterality Date  . TOTAL THYROIDECTOMY  2013    MEDS:   Current Outpatient Prescriptions on File Prior to Visit  Medication Sig Dispense Refill  . amphetamine-dextroamphetamine (ADDERALL) 5 MG tablet TK 1 T PO D  0  . levothyroxine (SYNTHROID, LEVOTHROID) 100 MCG tablet Take 1 tablet by mouth daily.  5  . NON FORMULARY [place] 40 + vitamin     No current facility-administered medications on file prior to visit.     ALLERGIES: Erythromycin  Family History  Problem Relation Age of Onset  . Breast cancer Mother 17       Double Mastectomy   . Breast cancer Other        maternal great aunt     SH:  Married, non smoker  Review of Systems  All other systems reviewed and are negative.   PHYSICAL EXAMINATION:    BP 108/70 (BP Location: Right Arm, Patient Position: Sitting, Cuff Size: Normal)   Pulse 70   Resp 14   Ht 5' 8.5" (1.74 m)   Wt 201 lb (91.2 kg)   LMP 01/20/2017   BMI 30.12 kg/m     General appearance: alert, cooperative and appears stated age  Assessment: Endometrial polyp Menorrhagia  Plan: Hysteroscopy with polyp resection, D&C is recommended.  Then can proceed with IUD placement (or in OR if pt preferred) but she is going to do the surgical procedure in [place] and then return here for the IUD placement.   ~15 minutes spent with patient >50% of time was in face to face discussion of above.

## 2017-01-26 DIAGNOSIS — H16223 Keratoconjunctivitis sicca, not specified as Sjogren's, bilateral: Secondary | ICD-10-CM | POA: Diagnosis not present

## 2017-01-26 DIAGNOSIS — H52203 Unspecified astigmatism, bilateral: Secondary | ICD-10-CM | POA: Diagnosis not present

## 2017-01-26 DIAGNOSIS — H524 Presbyopia: Secondary | ICD-10-CM | POA: Diagnosis not present

## 2017-02-04 ENCOUNTER — Telehealth: Payer: Self-pay | Admitting: Endocrinology

## 2017-02-04 NOTE — Telephone Encounter (Signed)
-----   Message from Elayne Snare, MD sent at 01/20/2017 11:57 AM EDT ----- Regarding: Records I have not seen any records coming in that were supposed to be faxed

## 2017-02-04 NOTE — Telephone Encounter (Signed)
Pts records were received on 8/10; per the conversation the pt had with Mardene Celeste she did not want to make an appt right now and asked for [place] to hold off until she called [place] back

## 2017-02-25 ENCOUNTER — Encounter: Payer: Self-pay | Admitting: Obstetrics & Gynecology

## 2017-03-10 ENCOUNTER — Telehealth: Payer: Self-pay | Admitting: Obstetrics & Gynecology

## 2017-03-10 NOTE — Telephone Encounter (Signed)
Advised of office recommendations for polyp removal as seen below from [place]. Patient verbalizes understanding and will call their offices to get more information.  Routing to provider for final review. Patient agreeable to disposition. Will close encounter.

## 2017-03-10 NOTE — Telephone Encounter (Signed)
Left message to call [facility] at [phone].  [phone]

## 2017-03-10 NOTE — Telephone Encounter (Signed)
Patient has a polyp that needs removed.  She would like to know the name of an office here in [place] that can do this for her.

## 2017-03-10 NOTE — Telephone Encounter (Signed)
[name], MD  to Renee [name]      10:10 PM  [facility] and [facility] do these in their office. Others might as well but I know for sure both of these do.    Renee [name]    This MyChart message has not been read.  Renee [name]  to [name], MD     8:14 AM  No problem. Yes Please that would be helpful.   Have a great weekend.  Renee [name]   February 26, 2017  [name], MD  to Renee [name]      12:06 AM  Renee [name],  I have contacted two offices that do this but they will not give me a quote. If you desire, I can give you information about two possible options and you can reach out to see since you will be the person having it done and it is your insurance. Sorry to not be more helpful.   Renee [name]    Last read by Renee [name] at 8:14 AM on 02/27/2017.  February 25, 2017  Renee [name]  to [name], MD     5:47 PM  Hello,  Just wanted to touch base about my polyp. I'm trying to get my Dr. in [place] to get a referral in [place] but am not sure it is going to happen. Dr. Sabra Heck mentioned there is an office in [place] that does the procedure in the office. Would it be possible to find out what the cost for this would be using my insurance.  Just trying to weigh out my options  I'm traveling on business next week but would be available through messaging.   Have a great weekend,  Renee [name]

## 2017-04-18 DIAGNOSIS — Z0000 Encounter for general adult medical examination without abnormal findings: Secondary | ICD-10-CM | POA: Diagnosis not present

## 2017-04-18 DIAGNOSIS — E038 Other specified hypothyroidism: Secondary | ICD-10-CM | POA: Diagnosis not present

## 2017-04-22 DIAGNOSIS — Z0000 Encounter for general adult medical examination without abnormal findings: Secondary | ICD-10-CM | POA: Diagnosis not present

## 2017-04-22 DIAGNOSIS — N946 Dysmenorrhea, unspecified: Secondary | ICD-10-CM | POA: Diagnosis not present

## 2017-04-22 DIAGNOSIS — F418 Other specified anxiety disorders: Secondary | ICD-10-CM | POA: Diagnosis not present

## 2017-04-22 DIAGNOSIS — M549 Dorsalgia, unspecified: Secondary | ICD-10-CM | POA: Diagnosis not present

## 2017-04-22 DIAGNOSIS — F900 Attention-deficit hyperactivity disorder, predominantly inattentive type: Secondary | ICD-10-CM | POA: Diagnosis not present

## 2017-04-22 DIAGNOSIS — Z1389 Encounter for screening for other disorder: Secondary | ICD-10-CM | POA: Diagnosis not present

## 2017-04-25 ENCOUNTER — Ambulatory Visit: Payer: BLUE CROSS/BLUE SHIELD | Admitting: Obstetrics & Gynecology

## 2017-04-25 NOTE — Progress Notes (Deleted)
46 y.o. G1P1 MarriedCaucasianF here for annual exam.    No LMP recorded.          Sexually active: {yes no:314532}  The current method of family planning is {contraception:315051}.    Exercising: {yes no:314532}  {types:19826} Smoker:  no  Health Maintenance: Pap:  01/13/16 Pap and HR HPV negative History of abnormal Pap:  no MMG:  *** Colonoscopy:  never BMD:   never TDaP:  unsure Pneumonia vaccine(s):  never Zostavax:   never Hep C testing: not indicated Screening Labs: ***, Hb today: ***, Urine today: ***   reports that  has never smoked. she has never used smokeless tobacco. She reports that she drinks alcohol. She reports that she does not use drugs.  Past Medical History:  Diagnosis Date  . Anxiety   . Depression   . Infertility, female   . Thyroid cancer ([place]) 2013  . Urinary incontinence     Past Surgical History:  Procedure Laterality Date  . TOTAL THYROIDECTOMY  2013    Current Outpatient Medications  Medication Sig Dispense Refill  . amphetamine-dextroamphetamine (ADDERALL) 5 MG tablet TK 1 T PO D  0  . levothyroxine (SYNTHROID, LEVOTHROID) 100 MCG tablet Take 1 tablet by mouth daily.  5  . levothyroxine (SYNTHROID, LEVOTHROID) 137 MCG tablet Take by mouth.    . NON FORMULARY [place] 40 + vitamin    . NON FORMULARY daily. Pregnenolone capsule    . TRINTELLIX 10 MG TABS TK 1 T PO D  3   No current facility-administered medications for this visit.     Family History  Problem Relation Age of Onset  . Breast cancer Mother 59       Double Mastectomy   . Breast cancer Other        maternal great aunt    ROS:  Pertinent items are noted in HPI.  Otherwise, a comprehensive ROS was negative.  Exam:   There were no vitals taken for this visit.  Weight change: @WEIGHTCHANGE @ Height:      Ht Readings from Last 3 Encounters:  01/25/17 5' 8.5" (1.74 m)  04/23/16 5\' 8"  (1.727 m)  01/13/16 5\' 9"  (1.753 m)    General appearance: alert, cooperative and  appears stated age Head: Normocephalic, without obvious abnormality, atraumatic Neck: no adenopathy, supple, symmetrical, trachea midline and thyroid {EXAM; THYROID:18604} Lungs: clear to auscultation bilaterally Breasts: {Exam; breast:13139::"normal appearance, no masses or tenderness"} Heart: regular rate and rhythm Abdomen: soft, non-tender; bowel sounds normal; no masses,  no organomegaly Extremities: extremities normal, atraumatic, no cyanosis or edema Skin: Skin color, texture, turgor normal. No rashes or lesions Lymph nodes: Cervical, supraclavicular, and axillary nodes normal. No abnormal inguinal nodes palpated Neurologic: Grossly normal   Pelvic: External genitalia:  no lesions              Urethra:  normal appearing urethra with no masses, tenderness or lesions              Bartholins and Skenes: normal                 Vagina: normal appearing vagina with normal color and discharge, no lesions              Cervix: {exam; cervix:14595}              Pap taken: {yes no:314532} Bimanual Exam:  Uterus:  {exam; uterus:12215}              Adnexa: {exam; adnexa:12223}  Rectovaginal: Confirms               Anus:  normal sphincter tone, no lesions  Chaperone was present for exam.  A:  Well Woman with normal exam  P:   {plan; gyn:5269::"mammogram","pap smear","return annually or prn"}

## 2017-04-29 DIAGNOSIS — Z1212 Encounter for screening for malignant neoplasm of rectum: Secondary | ICD-10-CM | POA: Diagnosis not present

## 2017-05-02 DIAGNOSIS — E039 Hypothyroidism, unspecified: Secondary | ICD-10-CM | POA: Diagnosis not present

## 2017-05-02 DIAGNOSIS — Z85850 Personal history of malignant neoplasm of thyroid: Secondary | ICD-10-CM | POA: Diagnosis not present

## 2017-05-02 DIAGNOSIS — Z803 Family history of malignant neoplasm of breast: Secondary | ICD-10-CM | POA: Diagnosis not present

## 2017-05-10 ENCOUNTER — Ambulatory Visit: Payer: BLUE CROSS/BLUE SHIELD | Admitting: Obstetrics & Gynecology

## 2017-05-10 ENCOUNTER — Encounter: Payer: Self-pay | Admitting: Obstetrics & Gynecology

## 2017-05-10 VITALS — BP 122/76 | HR 84 | Ht 68.5 in | Wt 199.0 lb

## 2017-05-10 DIAGNOSIS — Z01419 Encounter for gynecological examination (general) (routine) without abnormal findings: Secondary | ICD-10-CM | POA: Diagnosis not present

## 2017-05-10 NOTE — Progress Notes (Signed)
46 y.o. G1P1 MarriedCaucasianF here for annual exam.  Patient is not really keeping up with her cycles.  She had a recent cycle that was about two weeks late.  When it started, it was dark and old in appearance.  Bleeding was never heavy.  Has been having this dark, old spotting that has been increasing.  Has known polyps.  Removal has been recommended.  She has declined due to cost.   Going to [place] after the holidays.  She is going to see if she had the hysteroscopy scheduled.  Had ultrasound this summer.  Had a second polyp noted on ultrasound this summer.  I have discussed her doing this with a ob/gyn practice in [place] that can do this in office for cost reduction.  Patient's last menstrual period was 04/21/2017.          Sexually active: Yes.    The current method of family planning is coitus interruptus.    Exercising: Yes.    walking, yoga Smoker:  no  Health Maintenance: Pap:  01/13/16 Neg. HR HPV:neg  History of abnormal Pap:  no MMG: 2016 normal- [place]  TDaP:  ~4 years ago Screening Labs: PCP   reports that  has never smoked. she has never used smokeless tobacco. She reports that she drinks alcohol. She reports that she does not use drugs.  Past Medical History:  Diagnosis Date  . Anxiety   . Depression   . Infertility, female   . Thyroid cancer ([place]) 2013  . Urinary incontinence     Past Surgical History:  Procedure Laterality Date  . TOTAL THYROIDECTOMY  2013    Current Outpatient Medications  Medication Sig Dispense Refill  . amphetamine-dextroamphetamine (ADDERALL) 5 MG tablet TK 1 T PO D  0  . levothyroxine (SYNTHROID, LEVOTHROID) 100 MCG tablet Take 1 tablet by mouth daily.  5  . levothyroxine (SYNTHROID, LEVOTHROID) 125 MCG tablet Take 1 tablet by mouth daily.  3  . NON FORMULARY [place] 40 + vitamin    . NON FORMULARY daily. Pregnenolone capsule    . Pregnenolone Micronized POWD by Misc.(Non-Drug; Combo Route) route.     No current facility-administered  medications for this visit.     Family History  Problem Relation Age of Onset  . Breast cancer Mother 78       Double Mastectomy   . Breast cancer Other        maternal great aunt    ROS:  Pertinent items are noted in HPI.  Otherwise, a comprehensive ROS was negative.  Exam:   BP 122/76 (BP Location: Right Arm, Patient Position: Sitting, Cuff Size: Normal)   Pulse 84   Ht 5' 8.5" (1.74 m)   Wt 199 lb (90.3 kg)   LMP 04/21/2017   HC 14" (35.6 cm)   BMI 29.82 kg/m     Height: 5' 8.5" (174 cm)  Ht Readings from Last 3 Encounters:  05/10/17 5' 8.5" (1.74 m)  01/25/17 5' 8.5" (1.74 m)  04/23/16 5\' 8"  (1.727 m)    General appearance: alert, cooperative and appears stated age Head: Normocephalic, without obvious abnormality, atraumatic Neck: no adenopathy, supple, symmetrical, trachea midline and thyroid normal to inspection and palpation Lungs: clear to auscultation bilaterally Breasts: normal appearance, no masses or tenderness Heart: regular rate and rhythm Abdomen: soft, non-tender; bowel sounds normal; no masses,  no organomegaly Extremities: extremities normal, atraumatic, no cyanosis or edema Skin: Skin color, texture, turgor normal. No rashes or lesions Lymph nodes:  Cervical, supraclavicular, and axillary nodes normal. No abnormal inguinal nodes palpated Neurologic: Grossly normal   Pelvic: External genitalia:  no lesions              Urethra:  normal appearing urethra with no masses, tenderness or lesions              Bartholins and Skenes: normal                 Vagina: normal appearing vagina with normal color and discharge, no lesions              Cervix: no lesions              Pap taken: No. Bimanual Exam:  Uterus:  normal size, contour, position, consistency, mobility, non-tender              Adnexa: normal adnexa and no mass, fullness, tenderness               Rectovaginal: Confirms               Anus:  normal sphincter tone, no lesions  Chaperone was  present for exam.  A:  Well Woman with normal exam H/o endometrial polyp Hypothyroidism after thyroidectomy due to thyroid cancer Anxiety  P:   Mammogram guidelines reviewed.  Recommended doing locally.  Information provided.  She does not want my office to schedule this for her today. Pap smear and HR HPV neg 2017.  No pap smear obtained today. Lab work done with Dr. Buddy Duty this year Planning on having hysteroscopy done in [place] or locally for polyp removal. Return annually or prn

## 2017-05-12 ENCOUNTER — Telehealth: Payer: Self-pay | Admitting: Obstetrics & Gynecology

## 2017-05-18 ENCOUNTER — Encounter: Payer: Self-pay | Admitting: Obstetrics & Gynecology

## 2017-05-18 DIAGNOSIS — D485 Neoplasm of uncertain behavior of skin: Secondary | ICD-10-CM | POA: Diagnosis not present

## 2017-05-18 DIAGNOSIS — D229 Melanocytic nevi, unspecified: Secondary | ICD-10-CM | POA: Diagnosis not present

## 2017-05-18 DIAGNOSIS — D1801 Hemangioma of skin and subcutaneous tissue: Secondary | ICD-10-CM | POA: Diagnosis not present

## 2017-05-18 DIAGNOSIS — D239 Other benign neoplasm of skin, unspecified: Secondary | ICD-10-CM | POA: Diagnosis not present

## 2017-06-19 ENCOUNTER — Encounter: Payer: Self-pay | Admitting: Obstetrics & Gynecology

## 2017-06-20 ENCOUNTER — Telehealth: Payer: Self-pay | Admitting: Obstetrics & Gynecology

## 2017-06-20 ENCOUNTER — Encounter: Payer: Self-pay | Admitting: Obstetrics & Gynecology

## 2017-06-20 NOTE — Telephone Encounter (Signed)
Patient sent the following message through [place]. Routing to triage to assist patient with request.  ----- Message from [place], Generic sent at 06/19/2017 8:44 PM EST -----    Hi Dr. Sabra Heck,  Renee [name]. Hope you are doing well. Just wanted to touch base and update you on the visit to [facility]- with OBGYN there this past Friday. The Dr. agreed that the polyp should be removed. She told me the options of doing the Mirena while removing as well as an ablation. She reiterated that the ablation wouldn't help with pain and may need to be repeated in the future. She went ahead that day and did a biopsy and I will check back in with her office in 2 weeks to follow up. I'm looking at March or April surgery date as there is a bit of a wait. I'm OK with that and am thankful to be having it done up there.  There is a physical assesment that is very general that has to be filled out and faxed to their office. I'm hoping you could fill this out for me using the info from my last appointment and give a date of this week. If I need to come back in I will do that as well. Until the paper is faxed I cannot get a surgery appointment. see attached.Thanks!

## 2017-06-20 NOTE — Telephone Encounter (Signed)
Routing to [place] for review and advise regarding completion of form.

## 2017-06-20 NOTE — Telephone Encounter (Signed)
Patient sent in a follow up message through [place]. Routing to triage.  ----- Message from [place], Generic sent at 06/20/2017 11:54 AM EST -----    Hi Dr. Sabra Heck,    The Dr. in [place] also prescribed two medications that I would like to have filled in the [place] if possible. Would you be able to write  these prescriptions for me so that I could take with me to be done in [place]? Please see attached. Would the nurses there be able to verify that these would be covered under my insurance with [employer]?    Thanks,  [employer]

## 2017-06-20 NOTE — Telephone Encounter (Signed)
Per review of attachment in Mychart message it appears rx was written for Mirena IUD and Misoprostol 400 mcg pv night before procedure and morning of. Order to [place] for review.

## 2017-06-22 ENCOUNTER — Encounter: Payer: Self-pay | Admitting: Obstetrics & Gynecology

## 2017-06-22 NOTE — Telephone Encounter (Signed)
Routing to [place] and we discussed communicating with pt that I could do misoprostol RX but that I, personally, would not put in an IUD that came from another country.  In her case, this will go towards her deductible as she is using this for bleeding so would likely advise getting it in [place].  I can do the health form based on my last exam but I cannot do the anesthesia assessment portion of the exam form.  When it is done, does she want to come pick it up?  Thanks.

## 2017-06-22 NOTE — Telephone Encounter (Signed)
Call to patient. Detailed discussion regarding requested prescriptions as reviewed with Dr Sabra Heck.  Patient requests to check speciality pharmacy benefits for IUD and will consider having IUD inserted here after polypectomy, if has coverage.  Will send check benefits and call patient back. She is aware this may take several days.

## 2017-06-23 ENCOUNTER — Telehealth: Payer: Self-pay | Admitting: Obstetrics & Gynecology

## 2017-06-23 ENCOUNTER — Other Ambulatory Visit: Payer: Self-pay | Admitting: Obstetrics & Gynecology

## 2017-06-23 MED ORDER — MISOPROSTOL 200 MCG PO TABS: ORAL_TABLET | ORAL | 0 refills | Status: DC

## 2017-06-23 NOTE — Telephone Encounter (Signed)
Health form 06/23/2017 8:22 AM    To: Eyvonne H Enwright "Last Name Sir-Noi-Vich"    From: SpragueHarley Hallmark, RN    Created: 06/23/2017 8:22 AM     Nira Conn,  Dr.Miller has reviewed your health form and can complete the form based on your last exam, but she cannot do the anesthesia assessment portion of the exam form.When it is done, do you want to come pick it up?  Reesa Chew, RN

## 2017-06-23 NOTE — Telephone Encounter (Signed)
Message   ----- Message from [place], Generic sent at 06/22/2017 5:40 PM EST -----    Hello,    When I spoke to the nurse today I forgot to ask if your office would be able to fill out the physical report I had sent previously.  Please let me know if it wasn't received- It would need to be faxed to number on the sheet. I was in recently so was hoping that information would work. If not, I can make an appointment to come back in asap to have this completed. They cant book the surgery in [place] until this is completed.     Thanks,  Renee [name]   Form sent to Dr.Miller to review for completion.

## 2017-06-23 NOTE — Telephone Encounter (Signed)
Patient sent the following reply through MyChart:  ----- Message from [place], Generic sent at 06/23/2017 9:07 AM EST -----    Thanks so much!!!  ----- Message -----  From: Nurse Naaman Plummer  Sent: 06/23/2017 9:06 AM EST  To: Jonne Ply Broder  Subject: Health Form Complete  Jalie,    Dr.Miller has completed your health form with all of the information that she is able to provide based on what is on the form. There are some parts of the form that she is unable to complete and does not feel comfortable completing as she does not perform clearance assessments for anesthesiology. The form has been faxed to the number provided.    Reesa Chew, RN

## 2017-06-23 NOTE — Telephone Encounter (Signed)
Health Form Complete 06/23/2017 9:06 AM    To: [place]ila H Hitt "Last Name [place]"    From: SpragueHarley Hallmark, RN    Created: 06/23/2017 9:06 AM     Nira Conn,  Dr.Miller has completed your health form with all of the information that she is able to provide based on what is on the form. There are some parts of the form that she is unable to complete and does not feel comfortable completing as she does not perform clearance assessments for anesthesiology. The form has been faxed to the number provided.  Reesa Chew, RN     Routing to provider for final review. Patient agreeable to disposition. Will close encounter.

## 2017-06-23 NOTE — Telephone Encounter (Signed)
Message   ----- Message from [place], Generic sent at 06/22/2017 5:46 PM EST -----    Here is the medical form that I need filled out. It would need to be dated Jan 14 or later.     Thanks,  [name]   Form in [employer]

## 2017-06-29 ENCOUNTER — Encounter: Payer: Self-pay | Admitting: Obstetrics & Gynecology

## 2017-06-29 ENCOUNTER — Telehealth: Payer: Self-pay | Admitting: *Deleted

## 2017-06-29 NOTE — Telephone Encounter (Signed)
-----   Message from [place], Generic sent at 06/29/2017 2:40 PM EST -----    Hello-  Just following up to see if someone could please call [facility] to see if the [place] device would be covered under my insurance. If it is I will wait and have this done in [place] not in [place] as I don't have insurance for the device there.   I'm speaking with the [facility] Dr. on Friday and would like to have this information prior.    Thanks so much,  [employer]  [phone]

## 2017-06-30 NOTE — Telephone Encounter (Signed)
Routing to provider for final review. Patient agreeable to disposition. Will close encounter.     

## 2017-06-30 NOTE — Telephone Encounter (Signed)
Spoke with patient regarding benefits for a Mirena IUD device. Patient understood information presented is valid through the end of her insurance policy year, 1/61/09. Patient advises insurance should be the same after 07/08/17, but will call with new insurance information to pre-certify under the new policy year. Patient states she will look at proceeding with scheduling appointment for IUD insertion, after she has completed surgery in [place], which will probably be in March or April. Will close encounter.   cc: Dr Sabra Heck  cc: Lamont Snowball

## 2017-06-30 NOTE — Telephone Encounter (Signed)
See next encounter regarding IUD benefits and plan for procedure.  Encounter closed.

## 2017-07-11 ENCOUNTER — Encounter: Payer: Self-pay | Admitting: Obstetrics & Gynecology

## 2017-10-13 HISTORY — PX: HYSTEROSCOPY WITH D & C: SHX1775

## 2017-10-24 DIAGNOSIS — M79671 Pain in right foot: Secondary | ICD-10-CM | POA: Diagnosis not present

## 2017-10-24 DIAGNOSIS — F418 Other specified anxiety disorders: Secondary | ICD-10-CM | POA: Diagnosis not present

## 2017-10-24 DIAGNOSIS — M549 Dorsalgia, unspecified: Secondary | ICD-10-CM | POA: Diagnosis not present

## 2017-10-24 DIAGNOSIS — Z0000 Encounter for general adult medical examination without abnormal findings: Secondary | ICD-10-CM | POA: Diagnosis not present

## 2017-10-24 DIAGNOSIS — F900 Attention-deficit hyperactivity disorder, predominantly inattentive type: Secondary | ICD-10-CM | POA: Diagnosis not present

## 2017-12-26 ENCOUNTER — Encounter: Payer: Self-pay | Admitting: Obstetrics & Gynecology

## 2017-12-28 ENCOUNTER — Telehealth: Payer: Self-pay | Admitting: *Deleted

## 2017-12-28 NOTE — Telephone Encounter (Signed)
My Chart message from patient:  ----- Message from [place], Generic sent at 12/26/2017 1:19 PM EDT -----    Hello,    Just wanted to advise that I had the polyp removed in [place]. It was completely fine and I'm attaching pathology report for your records. The doctor there suggested I consider birth control pills to help with the up and down emotions that come for a week of the month. Just wondering if Dr. Sabra Heck thinks this would be a good idea. My periods are back to normal and I'm not having cramping ect. Just severe mood swings during the week prior to period.  Hope you are all having a wonderful summer. Thanks, [employer]

## 2017-12-28 NOTE — Telephone Encounter (Signed)
Call to patient. States she had surgery and denied any post-op problems.  Advised office visit recommended to review symptoms since then and determine options for management. Appointment scheduled for 01-10-18. Patient declined earlier appointment due to vacation plans.   Routing to provider for review. Will close encounter.

## 2017-12-28 NOTE — Telephone Encounter (Signed)
See phone encounter.

## 2018-01-10 ENCOUNTER — Encounter: Payer: Self-pay | Admitting: Obstetrics & Gynecology

## 2018-01-10 ENCOUNTER — Ambulatory Visit: Payer: BLUE CROSS/BLUE SHIELD | Admitting: Obstetrics & Gynecology

## 2018-01-10 VITALS — BP 100/66 | HR 64 | Resp 16 | Ht 68.5 in | Wt 203.4 lb

## 2018-01-10 DIAGNOSIS — R4586 Emotional lability: Secondary | ICD-10-CM | POA: Diagnosis not present

## 2018-01-10 DIAGNOSIS — N920 Excessive and frequent menstruation with regular cycle: Secondary | ICD-10-CM

## 2018-01-10 MED ORDER — NORETHIN ACE-ETH ESTRAD-FE 1-20 MG-MCG PO TABS: 1.0000 | ORAL_TABLET | Freq: Every day | ORAL | 3 refills | Status: DC

## 2018-01-10 MED ORDER — FLUOXETINE HCL 10 MG PO CAPS: 10.0000 mg | ORAL_CAPSULE | Freq: Every day | ORAL | 3 refills | Status: DC

## 2018-01-10 NOTE — Progress Notes (Signed)
GYNECOLOGY  VISIT  CC:   Follow-up after hysteroscopy and polyp resection, D&C  HPI: 47 y.o. G1P1 Married Caucasian female here for follow-up since having hysteroscopy and polyp resection, D&C.  Brought copy of benign pathology that will be scanned into her chart.  Cycles have been more regular since the hysteroscopy.  Cycles are 21 days.  Flow lasts 6 days.  At least two days are heavy.  Needs to change products every 3 hours.  Cramping has improved as well.    Started Prozac about a month ago.  Is on 20mg  daily.  Dr. Virgina Jock started this for her.  Discussed with pt Prozac's use with PMDD.  She has questions about dosage which were answered.    Reports she continues to have cyclical mood changes that feel very related to her cycle.  The prozac hasn't really helped this.  Feels this is worsening gradually over time and is interfering with life.  Would like to consider other options.  Provider in [place] suggested [place].  Pt used these years ago and did not have any issues with them.  Risks discussed with pt in detail including DUB, DVT/PE, headache, nausea, increased BP.  Aware if has any issues/concerns after starting then should call.  GYNECOLOGIC HISTORY: Patient's last menstrual period was 01/07/2018. Contraception: none  Patient Active Problem List   Diagnosis Date Noted  . Attention deficit disorder (ADD) in adult 08/30/2016  . Post-surgical hypothyroidism 08/30/2016  . Endometrial polyp 01/14/2016  . Thyroid cancer ([place]) 01/14/2016  . Hypothyroidism 01/14/2016    Past Medical History:  Diagnosis Date  . Anxiety   . Depression   . Fibroid, uterine   . Infertility, female   . Thyroid cancer ([place]) 2013  . Urinary incontinence     Past Surgical History:  Procedure Laterality Date  . TOTAL THYROIDECTOMY  2013  . UTERINE FIBROID SURGERY  10/13/2017    MEDS:   Current Outpatient Medications on File Prior to Visit  Medication Sig Dispense Refill  . amphetamine-dextroamphetamine  (ADDERALL) 5 MG tablet TK 1 T PO D  0  . FLUoxetine (PROZAC) 20 MG capsule Take 1 capsule by mouth daily.  4  . levothyroxine (SYNTHROID, LEVOTHROID) 125 MCG tablet Take 1 tablet by mouth daily. For 2 days a week  3  . levothyroxine (SYNTHROID, LEVOTHROID) 137 MCG tablet Take 1 tablet by mouth daily. For 5 days a week  5  . NON FORMULARY [place] 40 + vitamin    . NON FORMULARY daily. Pregnenolone capsule     No current facility-administered medications on file prior to visit.     ALLERGIES: Erythromycin  Family History  Problem Relation Age of Onset  . Breast cancer Mother 35       Double Mastectomy   . Breast cancer Other        maternal great aunt    SH:  Married, non smoker  Review of Systems  Psychiatric/Behavioral:       Mood changes  All other systems reviewed and are negative.   PHYSICAL EXAMINATION:    BP 100/66 (BP Location: Right Arm, Patient Position: Sitting, Cuff Size: Large)   Pulse 64   Resp 16   Ht 5' 8.5" (1.74 m)   Wt 203 lb 6.4 oz (92.3 kg)   LMP 01/07/2018   BMI 30.48 kg/m     General appearance: alert, cooperative and appears stated age No other exam performed  Assessment: 21 day cycles, polymenorrhea Mood changes  Plan: Will  start Loestrin 1/20 daily.  rx to pharmacy.  Continuous active use discussed.  Would like her to use these with a placebo week for at least two months before deciding to start continuous active pills.  Has BP cuff at home so will check this after being on pills for about 3 weeks.  Knows to call with any new side effects.  Also, advised to call if BP 130/90. AEX scheduled 06/2018.   ~20 minutes spent with patient >50% of time was in face to face discussion of above.

## 2018-01-13 ENCOUNTER — Encounter: Payer: Self-pay | Admitting: Obstetrics & Gynecology

## 2018-01-16 ENCOUNTER — Telehealth: Payer: Self-pay | Admitting: Obstetrics & Gynecology

## 2018-01-16 ENCOUNTER — Encounter: Payer: Self-pay | Admitting: Obstetrics & Gynecology

## 2018-01-16 NOTE — Telephone Encounter (Signed)
Message   Hi Olivia Mackie,    Thanks for letting me know. Would Dr. Sabra Heck be able to request refill of presecription for Fluxine that was just prescribed last week when it runs out or should I obtain these also through my Primary care Dr?    Nira Conn  ----- Message -----  From: Nurse Kallie Edward  Sent: 01/16/2018 12:58 PM EDT  To: Jonne Ply Treese  Subject: RE: Non-Urgent Medical Question  Ms. Severtson,   My name is Olivia Mackie and I am a triage nurse for Dr. Sabra Heck. While our office has the ability to electronically send prescriptions, unfortunately, cannot electronically send prescriptions for Adderal or any other controlled medication. Usually, Adderall is only prescribed by Primary care or a speciality office like Psychiatry. I am sorry that we couldn't be of help to you for this!     Sincerely,   Karen Chafe, RN     ----- Message -----   From: Berton Bon   Sent: 01/16/2018 11:36 AM EDT    To: [name], MD  Subject: Non-Urgent Medical Question    Hello,    Just wondering if your office has the ability to electronically refill prescriptions? My current primary Dr. Delano Metz have this cabability and their office is a 35 min drive each month for me to get prescription for my Adderal 5mg . This would be really helpful for me if it could be done through your office. My prescription needs refilled on the 15th of this month.    Thanks,  [employer]

## 2018-01-16 NOTE — Telephone Encounter (Signed)
Hello,    Just wondering if your office has the ability to electronically refill prescriptions? My current primary Dr. Delano Metz have this cabability and their office is a 35 min drive each month for me to get prescription for my Adderal 5mg . This would be really helpful for me if it could be done through your office. My prescription needs refilled on the 15th of this month.    Thanks,  [employer]

## 2018-01-16 NOTE — Telephone Encounter (Signed)
Responded to patient via mychart. Will close encounter.  Advised since Dr. Sabra Heck wrote Rx she can refill the Prozac.

## 2018-01-16 NOTE — Telephone Encounter (Signed)
RE: Non-Urgent Medical Question 01/16/2018 12:58 PM    To: Kaelynne H Jeon    From: Michele Mcalpine, RN    Created: 01/16/2018 12:58 PM     Ms. Germond,  My name is Olivia Mackie and I am a triage nurse for Dr. Sabra Heck. While our office has the ability to electronically send prescriptions, unfortunately, cannot electronically send prescriptions for Adderal or any other controlled medication.Usually, Adderall is only prescribed by Primary care or a speciality office like Psychiatry.I am sorry that we couldn't be of help to you for this!   Sincerely,  Karen Chafe, RN

## 2018-01-16 NOTE — Telephone Encounter (Signed)
Responded to patient via mychart, unable to send adderall rx.  Encounter to Dr. Sabra Heck and closed.

## 2018-02-14 DIAGNOSIS — M722 Plantar fascial fibromatosis: Secondary | ICD-10-CM | POA: Diagnosis not present

## 2018-02-14 DIAGNOSIS — M76821 Posterior tibial tendinitis, right leg: Secondary | ICD-10-CM | POA: Diagnosis not present

## 2018-02-24 DIAGNOSIS — M722 Plantar fascial fibromatosis: Secondary | ICD-10-CM | POA: Diagnosis not present

## 2018-03-07 DIAGNOSIS — M71571 Other bursitis, not elsewhere classified, right ankle and foot: Secondary | ICD-10-CM | POA: Diagnosis not present

## 2018-03-07 DIAGNOSIS — M722 Plantar fascial fibromatosis: Secondary | ICD-10-CM | POA: Diagnosis not present

## 2018-03-20 DIAGNOSIS — H5213 Myopia, bilateral: Secondary | ICD-10-CM | POA: Diagnosis not present

## 2018-04-21 DIAGNOSIS — N3281 Overactive bladder: Secondary | ICD-10-CM | POA: Diagnosis not present

## 2018-04-21 DIAGNOSIS — E038 Other specified hypothyroidism: Secondary | ICD-10-CM | POA: Diagnosis not present

## 2018-04-21 DIAGNOSIS — Z0000 Encounter for general adult medical examination without abnormal findings: Secondary | ICD-10-CM | POA: Diagnosis not present

## 2018-04-28 DIAGNOSIS — F418 Other specified anxiety disorders: Secondary | ICD-10-CM | POA: Diagnosis not present

## 2018-04-28 DIAGNOSIS — E038 Other specified hypothyroidism: Secondary | ICD-10-CM | POA: Diagnosis not present

## 2018-04-28 DIAGNOSIS — Z1389 Encounter for screening for other disorder: Secondary | ICD-10-CM | POA: Diagnosis not present

## 2018-04-28 DIAGNOSIS — M2550 Pain in unspecified joint: Secondary | ICD-10-CM | POA: Diagnosis not present

## 2018-04-28 DIAGNOSIS — Z0000 Encounter for general adult medical examination without abnormal findings: Secondary | ICD-10-CM | POA: Diagnosis not present

## 2018-04-28 DIAGNOSIS — F900 Attention-deficit hyperactivity disorder, predominantly inattentive type: Secondary | ICD-10-CM | POA: Diagnosis not present

## 2018-05-02 ENCOUNTER — Other Ambulatory Visit: Payer: Self-pay | Admitting: Obstetrics & Gynecology

## 2018-05-02 ENCOUNTER — Encounter: Payer: Self-pay | Admitting: Obstetrics & Gynecology

## 2018-05-02 DIAGNOSIS — Z1212 Encounter for screening for malignant neoplasm of rectum: Secondary | ICD-10-CM | POA: Diagnosis not present

## 2018-05-02 LAB — IFOBT (OCCULT BLOOD): IFOBT: NEGATIVE

## 2018-05-02 NOTE — Telephone Encounter (Signed)
Spoke with patient. Patient is taking Loestrin 1/20. Patient has not been taking the pills continuously active as discussed at her office visit with D'Hanis on 01/10/2018. Patient is having her cycle monthly during the 4th week of her pill pack. Patient states her BP has been normal since starting OCP. Reports her cycles have been lighter, but she has not noticed a change in her mood at all with OCP. Advised will review with Dr.Miller and return call with recommendations.

## 2018-05-02 NOTE — Telephone Encounter (Signed)
Message   Hello-  * My pharmacy just sent a request for refill on the [place] birth control. If possible I need to pick this up today as we are going out of town.    [facility].    Thanks!  Happy Thanksgiving.  Nira Conn

## 2018-05-08 ENCOUNTER — Other Ambulatory Visit: Payer: Self-pay | Admitting: Obstetrics & Gynecology

## 2018-05-08 ENCOUNTER — Telehealth: Payer: Self-pay | Admitting: Obstetrics & Gynecology

## 2018-05-08 MED ORDER — NORETHIN ACE-ETH ESTRAD-FE 1-20 MG-MCG PO TABS: ORAL_TABLET | ORAL | 1 refills | Status: DC

## 2018-05-08 NOTE — Telephone Encounter (Signed)
I wrote a prescription for the pharmacy for continuous active pills.  I wrote for 90 day supply, so four packs so she doesn't have to go every three weeks to get it filled.  She can change this to every three weeks if she desires when she goes to the pharmacy.

## 2018-05-08 NOTE — Telephone Encounter (Signed)
Dr. Sabra Heck,  Patient wants to try continuous active pills.  Can you write new Rx?

## 2018-05-08 NOTE — Telephone Encounter (Signed)
Patient sent the following correspondence through [place]. Routing to triage to assist patient with request.  Hi Dr Sabra Heck,    [facility] you and your family had a wonderful Thanksgiving. I would like to try the continuous active on pills.  Should I just message your office when these pills are finished or can we go ahead and write a prescription for another pack. Not sure how it works with insurance.    Thanks so much,  Water quality scientist  ----- Message -----  From: [name], MD  Sent: 05/02/2018 6:18 PM EST  To: Jonne Ply Philipps  Subject: presciption  Mrs. Crmojevic,  I'm sorry it's so late but I did send in the refill for the pills. I checked the pharmacy and they are open until 10pm.     You may want to consider taking them as continuous active, skipping the placebo pills. This would mean taking the same dosage every day. This way you could see if no hormonal fluctuation helps mood at all. If not, then we may need to consider approaching this more from a mood standpoint that hormonal standpoint. I have not written the prescription as continuous active so if you want to try it this way after starting the next pack, please let me know and I can make that change. Thanks.    Edwinna Areola

## 2018-05-09 NOTE — Telephone Encounter (Signed)
Message to patient via mychart that new rx was sent for continuous active pills.  Will close encounter.

## 2018-05-14 ENCOUNTER — Other Ambulatory Visit: Payer: Self-pay | Admitting: Obstetrics & Gynecology

## 2018-05-15 NOTE — Telephone Encounter (Signed)
Medication refill request: FLUoxetine Last AEX:  05/10/17 SM Next AEX: 06/16/18 Refill authorized: 01/10/18 #30 w/3 refills; today please advise

## 2018-05-24 ENCOUNTER — Encounter: Payer: Self-pay | Admitting: Obstetrics & Gynecology

## 2018-05-24 ENCOUNTER — Telehealth: Payer: Self-pay | Admitting: Obstetrics & Gynecology

## 2018-05-24 NOTE — Telephone Encounter (Signed)
Patient sent the following correspondence through [place]. Routing to Refill pool to assist patient with request.  Good Morning,    Hope you all are well. I'm having difficulty with my pharmacy filling this prescription. I take my last pill tonight and want to do the continuous as Dr. Sabra Heck has prescribed. They seem to have the new prescription but the insurance company doesn't want to fill it yet. Do you have any suggestions? Is your office able to call the insurance company on my behalf?     Thanks so much,  [employer]

## 2018-05-24 NOTE — Telephone Encounter (Signed)
Called patient and left a detailed message at the number on her DPR explaining that it appears that she filled one pack on 05/04/18 therefore her insurance will not fill another pack until the end of December. Advised a call back if she has any other questions.

## 2018-06-05 ENCOUNTER — Other Ambulatory Visit: Payer: Self-pay | Admitting: Obstetrics & Gynecology

## 2018-06-05 ENCOUNTER — Encounter: Payer: Self-pay | Admitting: Obstetrics & Gynecology

## 2018-06-05 MED ORDER — NORETHINDRONE ACET-ETHINYL EST 1-20 MG-MCG PO TABS: 1.0000 | ORAL_TABLET | Freq: Every day | ORAL | 3 refills | Status: DC

## 2018-06-05 NOTE — Telephone Encounter (Signed)
OCP was written as Loestrin 1/20 FE so pharmacy will not fill early.  She wants to take continuous active so she needs to be able to get this every three weeks.  Rx changed to Loestrin 1/20 without the FE and for continuous active.  New rx sent to pharmacy.  Message sent to pt via mychart.  This is her preferred means of communication.  Ok to close encounter.

## 2018-06-06 ENCOUNTER — Telehealth: Payer: Self-pay | Admitting: Obstetrics & Gynecology

## 2018-06-06 ENCOUNTER — Encounter: Payer: Self-pay | Admitting: Obstetrics & Gynecology

## 2018-06-06 NOTE — Telephone Encounter (Signed)
Patient sent the following correspondence through [place]. Routing to triage to assist patient with request.  Hi Dr Sabra Heck,    [facility] you had a wonderful Christmas with your family! Thanks for your message. They finally did fill the last prescription (with iron). Instead of stopping the pills once I finished the months pills , I continued taking the new pack. Unfortunately my period came with a vengeance this month. Ugh! Should I go ahead and finish this pack before starting the new prescription? Im still thinking the [place]ar [facility]a might be the way to go. Maybe will do this Spring.  Happy New Year!!  ----- Message -----  From: [name], MD  Sent: 06/05/2018 1:18 PM EST  To: Jonne Ply Klipfel  Subject: prescription  Mrs. Dorsainvil,  I think one of the medical assistants at my office called and left you a message but I need to give you a little more information. I think the reason the pharmacy wouldn't fill it early is because the pill was written at [facility] 1/20 FE which means the placebo pill contains iron. With this pill, it is assumed that I want you to take the placebo week because it has iron in it.    So, I've changed the prescription to Loestrin 1/20 (without the FE) and I wrote for a 90 day supply if you want this.    Since it is a new prescription, you should be able to get this today and because I wrote the one without the iron the placebo pills, you should be able to get it filled every three weeks or get 4 packs for a 3 month supply.     Please let me know if you have any issues AND if you are able to get this filled. I shouldn't need to do anything else (like a prior authorization) but you never know.    Keep me posted. Thanks.    Edwinna Areola

## 2018-06-06 NOTE — Telephone Encounter (Signed)
Routing to Dr. Sabra Heck to review advise.

## 2018-06-16 ENCOUNTER — Other Ambulatory Visit (HOSPITAL_COMMUNITY)
Admission: RE | Admit: 2018-06-16 | Discharge: 2018-06-16 | Disposition: A | Discharge status: Home / Self Care | Payer: BLUE CROSS/BLUE SHIELD | Source: Ambulatory Visit | Attending: Obstetrics & Gynecology | Admitting: Obstetrics & Gynecology

## 2018-06-16 ENCOUNTER — Ambulatory Visit: Payer: BLUE CROSS/BLUE SHIELD | Admitting: Obstetrics & Gynecology

## 2018-06-16 ENCOUNTER — Encounter: Payer: Self-pay | Admitting: Obstetrics & Gynecology

## 2018-06-16 ENCOUNTER — Other Ambulatory Visit: Payer: Self-pay

## 2018-06-16 VITALS — BP 120/62 | HR 80 | Resp 16 | Ht 68.5 in

## 2018-06-16 DIAGNOSIS — Z1211 Encounter for screening for malignant neoplasm of colon: Secondary | ICD-10-CM

## 2018-06-16 DIAGNOSIS — Z124 Encounter for screening for malignant neoplasm of cervix: Secondary | ICD-10-CM

## 2018-06-16 DIAGNOSIS — Z01419 Encounter for gynecological examination (general) (routine) without abnormal findings: Secondary | ICD-10-CM

## 2018-06-16 MED ORDER — FLUOXETINE HCL 10 MG PO CAPS: 10.0000 mg | ORAL_CAPSULE | Freq: Two times a day (BID) | ORAL | 2 refills | Status: DC

## 2018-06-16 NOTE — Patient Instructions (Addendum)
Renee [name] (adult) and Renee [name] (pediatric) [facility]  [phone] [place], [place] [address] Phone: [phone] Fax: [phone]

## 2018-06-16 NOTE — Progress Notes (Signed)
48 y.o. G1P1 Married White or Caucasian female here for annual exam.  She's going to take continuous active OCP starting this month.  Cycles have been regular except for a little spotting.  She did have a 10 day cycle in December.  Flow has been light with the OCPs.    She decreased her prozac 20mg  to 10mg .  The 20mg  Patient's last menstrual period was 05/29/2018 (exact date).          Sexually active: Yes.    The current method of family planning is OCP (estrogen/progesterone).    Exercising: Yes.    yoga, walking Smoker:  no  Health Maintenance: Pap:  01/13/16 Neg. HR HPV:neg  History of abnormal Pap:  no MMG:  2016 Normal.  Pt is going to schedule this.  Advised 3D MMG. Colonoscopy:  Never BMD:   Never TDaP:  ~2014 Screening Labs: PCP   reports that she has never smoked. She has never used smokeless tobacco. She reports current alcohol use. She reports that she does not use drugs.  Past Medical History:  Diagnosis Date  . Anxiety   . Depression   . Endometrial polyp 01/14/2016  . Fibroid, uterine   . Infertility, female   . Thyroid cancer ([place]) 2013  . Urinary incontinence     Past Surgical History:  Procedure Laterality Date  . HYSTEROSCOPY W/D&C  10/13/2017   polyp resection  . TOTAL THYROIDECTOMY  2013    Current Outpatient Medications  Medication Sig Dispense Refill  . ADDERALL XR 10 MG 24 hr capsule Take 1 capsule by mouth daily.  0  . FLUoxetine (PROZAC) 10 MG capsule TAKE 1 CAPSULE(10 MG) BY MOUTH DAILY 30 capsule 3  . levothyroxine (SYNTHROID, LEVOTHROID) 125 MCG tablet Take 1 tablet by mouth daily. For 2 days a week  3  . levothyroxine (SYNTHROID, LEVOTHROID) 137 MCG tablet Take 1 tablet by mouth daily. For 5 days a week  5  . NON FORMULARY [place] 40 + vitamin    . NON FORMULARY daily. Pregnenolone capsule    . norethindrone-ethinyl estradiol (MICROGESTIN,JUNEL,LOESTRIN) 1-20 MG-MCG tablet Take 1 tablet by mouth daily. Pt is skipping placebo pills and taking  continuous only.  Please dispense 3 month supply (4 packs) if possible for pt convenience. 1 Package 3   No current facility-administered medications for this visit.     Family History  Problem Relation Age of Onset  . Breast cancer Mother 79       Double Mastectomy   . Breast cancer Other        maternal great aunt    Review of Systems  Constitutional:       Weight gain   Genitourinary:       Menstrual cycle changes   Musculoskeletal: Positive for myalgias.  All other systems reviewed and are negative.   Exam:   BP 120/62 (BP Location: Right Arm, Patient Position: Sitting, Cuff Size: Large)   Pulse 80   Resp 16   Ht 5' 8.5" (1.74 m)   LMP 05/29/2018 (Exact Date)   BMI 30.48 kg/m   Height: 5' 8.5" (174 cm)  Ht Readings from Last 3 Encounters:  06/16/18 5' 8.5" (1.74 m)  01/10/18 5' 8.5" (1.74 m)  05/10/17 5' 8.5" (1.74 m)    General appearance: alert, cooperative and appears stated age Head: Normocephalic, without obvious abnormality, atraumatic Neck: no adenopathy, supple, symmetrical, trachea midline and thyroid normal to inspection and palpation Lungs: clear to auscultation bilaterally Breasts: normal  appearance, no masses or tenderness Heart: regular rate and rhythm Abdomen: soft, non-tender; bowel sounds normal; no masses,  no organomegaly Extremities: extremities normal, atraumatic, no cyanosis or edema Skin: Skin color, texture, turgor normal. No rashes or lesions Lymph nodes: Cervical, supraclavicular, and axillary nodes normal. No abnormal inguinal nodes palpated Neurologic: Grossly normal   Pelvic: External genitalia:  no lesions              Urethra:  normal appearing urethra with no masses, tenderness or lesions              Bartholins and Skenes: normal                 Vagina: normal appearing vagina with normal color and discharge, no lesions              Cervix: no lesions              Pap taken: Yes.   Bimanual Exam:  Uterus:  normal size,  contour, position, consistency, mobility, non-tender              Adnexa: normal adnexa and no mass, fullness, tenderness               Rectovaginal: Confirms               Anus:  normal sphincter tone, no lesions  Chaperone was present for exam.  A:  Well Woman with normal exam H/o endometrial polyp, s/p resection 5/19 ADD Anxiety Hypothyroidism after thyroidectomy due to thyroid cancer   P:   Mammogram guidelines reviewed.  Pt states she will scheduled. pap smear obtained today Prozac 10mg  BID.  She will do 10mg  days 1-14 each months and 20mg  days 15-28.  She may want to take 15mg  daily.  Will let me know Lab work done with Dr. Wallace Keller Stevenson's name and office information given.  She may want to see her and knows to call if needs referral. IFOB given She is going to give update about continuous active OCPs. return annually or prn

## 2018-06-18 ENCOUNTER — Encounter: Payer: Self-pay | Admitting: Obstetrics & Gynecology

## 2018-06-19 ENCOUNTER — Telehealth: Payer: Self-pay | Admitting: Obstetrics & Gynecology

## 2018-06-19 ENCOUNTER — Other Ambulatory Visit: Payer: Self-pay | Admitting: Obstetrics & Gynecology

## 2018-06-19 DIAGNOSIS — Z1231 Encounter for screening mammogram for malignant neoplasm of breast: Secondary | ICD-10-CM

## 2018-06-19 LAB — CYTOLOGY - PAP: Diagnosis: NEGATIVE

## 2018-06-19 NOTE — Telephone Encounter (Signed)
Rerouting to [facility] for records request. Thank you!

## 2018-06-19 NOTE — Telephone Encounter (Signed)
Responded to patient via mychart and gave phone number to the breast center.   Routing back to [place] to assist with records.

## 2018-06-19 NOTE — Telephone Encounter (Signed)
Patient sent the following correspondence through [place]. Routing to triage to assist patient with request.  Hello,    [facility] everyone had a great weekend. Just following up to make sure that my I FOB test results and blood work results that I recently took at [facility] were received. They sent the info on Friday after I requested it.  Also can you please send me the numbers of the 3-D imaging place for Mammograms. I lost the paper given.    Thanks so much,  [employer]

## 2018-06-28 NOTE — Telephone Encounter (Signed)
OK to close or is further follow up necessary? °

## 2018-06-28 NOTE — Telephone Encounter (Signed)
Ok to close. This was done correctly for pt to pickup.

## 2018-07-17 ENCOUNTER — Ambulatory Visit
Admission: RE | Admit: 2018-07-17 | Discharge: 2018-07-17 | Disposition: A | Discharge status: Home / Self Care | Payer: BLUE CROSS/BLUE SHIELD | Source: Ambulatory Visit | Attending: Obstetrics & Gynecology | Admitting: Obstetrics & Gynecology

## 2018-07-17 DIAGNOSIS — Z1231 Encounter for screening mammogram for malignant neoplasm of breast: Secondary | ICD-10-CM

## 2018-08-01 ENCOUNTER — Encounter: Payer: Self-pay | Admitting: Obstetrics & Gynecology

## 2018-08-10 ENCOUNTER — Telehealth: Payer: Self-pay | Admitting: Obstetrics & Gynecology

## 2018-08-10 NOTE — Telephone Encounter (Signed)
Patient left voicemail over lunch stating that the pharmacy filled her prescription for 1x per day instead of 2x per day. Patient is calling because she is now out of medication.

## 2018-08-10 NOTE — Telephone Encounter (Signed)
Spoke with patient. Patient states she resolved the problem with the pharmacy, no further assistance is needed. Patient thankful for return call.   Encounter closed.

## 2018-08-21 ENCOUNTER — Other Ambulatory Visit: Payer: Self-pay | Admitting: Obstetrics & Gynecology

## 2018-08-21 NOTE — Telephone Encounter (Signed)
Medication refill request: junel  Last AEX:  06/16/18 SM Next AEX: 10/19/19 Last MMG (if hormonal medication request): 07/17/18 [id number]:Neg  Refill authorized: 06/05/18 #[id number]/[id number]   Today please advise.

## 2018-10-27 DIAGNOSIS — F418 Other specified anxiety disorders: Secondary | ICD-10-CM | POA: Diagnosis not present

## 2018-10-27 DIAGNOSIS — E039 Hypothyroidism, unspecified: Secondary | ICD-10-CM | POA: Diagnosis not present

## 2018-10-27 DIAGNOSIS — E663 Overweight: Secondary | ICD-10-CM | POA: Diagnosis not present

## 2018-10-27 DIAGNOSIS — F900 Attention-deficit hyperactivity disorder, predominantly inattentive type: Secondary | ICD-10-CM | POA: Diagnosis not present

## 2018-11-08 ENCOUNTER — Encounter: Payer: Self-pay | Admitting: Obstetrics & Gynecology

## 2018-11-09 ENCOUNTER — Other Ambulatory Visit: Payer: Self-pay | Admitting: Obstetrics & Gynecology

## 2018-11-09 MED ORDER — NORETHIN ACE-ETH ESTRAD-FE 1-20 MG-MCG PO TABS: ORAL_TABLET | ORAL | 3 refills | Status: DC

## 2018-11-22 DIAGNOSIS — S43401A Unspecified sprain of right shoulder joint, initial encounter: Secondary | ICD-10-CM | POA: Diagnosis not present

## 2018-11-22 DIAGNOSIS — S139XXA Sprain of joints and ligaments of unspecified parts of neck, initial encounter: Secondary | ICD-10-CM | POA: Diagnosis not present

## 2019-03-08 DIAGNOSIS — Z808 Family history of malignant neoplasm of other organs or systems: Secondary | ICD-10-CM | POA: Diagnosis not present

## 2019-03-08 DIAGNOSIS — I781 Nevus, non-neoplastic: Secondary | ICD-10-CM | POA: Diagnosis not present

## 2019-03-08 DIAGNOSIS — L738 Other specified follicular disorders: Secondary | ICD-10-CM | POA: Diagnosis not present

## 2019-05-09 ENCOUNTER — Telehealth: Payer: Self-pay

## 2019-05-09 ENCOUNTER — Other Ambulatory Visit: Payer: Self-pay | Admitting: Obstetrics & Gynecology

## 2019-05-09 ENCOUNTER — Encounter: Payer: Self-pay | Admitting: Obstetrics & Gynecology

## 2019-05-09 NOTE — Telephone Encounter (Signed)
[name], Renee Brooks, MD 39 minutes ago (2:52 PM)     Hi.  I'm taking the norethindrone and ethinl estradiol tablets and have been for some time.  My pharmacy is saying there are no refills left and that the office there has declined.  when I log onto my chart It shows I have refills but not according to [facility] [address].  Please help.   Call placed to pt. Spoke with pt regarding Junel Rx. Pt states only picking up 3 packs at pharmacy at a time instead of 4. Pt states doing this regimen with this OCPs is working really well and loves it. Had OV in 11/2018. Next AEX 10/2019.   Will route to Dr Sabra Heck for review and Rx refills request. Need to send new Rx to last to AEX?

## 2019-05-11 ENCOUNTER — Other Ambulatory Visit: Payer: Self-pay | Admitting: Obstetrics & Gynecology

## 2019-05-11 MED ORDER — NORETHIN ACE-ETH ESTRAD-FE 1-20 MG-MCG PO TABS: ORAL_TABLET | ORAL | 1 refills | Status: DC

## 2019-05-11 NOTE — Telephone Encounter (Signed)
Rx completed.  Ok to close encounter.   °

## 2019-05-16 ENCOUNTER — Encounter: Payer: Self-pay | Admitting: Obstetrics & Gynecology

## 2019-05-16 ENCOUNTER — Telehealth: Payer: Self-pay | Admitting: Obstetrics & Gynecology

## 2019-05-16 NOTE — Telephone Encounter (Signed)
Hormone dosage is the same.  The only difference is the "Fe" part which is some low dosed iron tablets in the place of placebo pills the last week in the pack.  Again, hormonal dosage is the same.  Ok to proceed with starting these.

## 2019-05-16 NOTE — Telephone Encounter (Signed)
Patient sent the following message through [place]. Routing to triage to assist patient with request.  [name], [name]  Phone Number: [phone]        Hi Dr. Sabra Heck,   I picked up my prescription for birth control. They gave me 4 packs and it is a different pack. Looks like the only difference is the iron pill. Just wanted to confirm that I'm OK to take this for 3 weeks and then move to a new pack ignoring those iron pills.  I believe that is the only difference in the packs.   Thanks for please confirming. I take my last of the pink pack tonight.   Hope you are doing well!  Renee [name]

## 2019-05-16 NOTE — Telephone Encounter (Signed)
Spoke to pt. Pt had refill of Junel Rx last week on 05/09/19 but has gotten the Rx from pharmacy and noticed that pills are different color from pink tabs before (Microgestin, Junel, Loestrin 1/20) and now has new pack that says (Junel FE 1/20) with brown and yellow pills . Wants to know if same Rx ? Has to start new pack tomorrow night (05/17/19).   Will route to Dr Sabra Heck for advice and recommendations.

## 2019-05-17 NOTE — Telephone Encounter (Signed)
Left detailed message per DPR on instructions with new OCPs pack. No need to call back unless has questions.   Will route to Dr Sabra Heck for final review and will close encounter .

## 2019-06-08 ENCOUNTER — Other Ambulatory Visit: Payer: Self-pay | Admitting: Obstetrics & Gynecology

## 2019-06-11 NOTE — Telephone Encounter (Signed)
Medication refill request: prozac Last AEX: 06/16/18   Next AEX: 10/19/19 Last MMG (if hormonal medication request): 07/17/18 Bi-rads 1 neg  Refill authorized: #30 with 4 Rf pended for today.

## 2019-06-20 DIAGNOSIS — Z20828 Contact with and (suspected) exposure to other viral communicable diseases: Secondary | ICD-10-CM | POA: Diagnosis not present

## 2019-07-17 DIAGNOSIS — E039 Hypothyroidism, unspecified: Secondary | ICD-10-CM | POA: Diagnosis not present

## 2019-07-17 DIAGNOSIS — Z0000 Encounter for general adult medical examination without abnormal findings: Secondary | ICD-10-CM | POA: Diagnosis not present

## 2019-07-24 DIAGNOSIS — Z1331 Encounter for screening for depression: Secondary | ICD-10-CM | POA: Diagnosis not present

## 2019-07-24 DIAGNOSIS — M79671 Pain in right foot: Secondary | ICD-10-CM | POA: Diagnosis not present

## 2019-07-24 DIAGNOSIS — Z0000 Encounter for general adult medical examination without abnormal findings: Secondary | ICD-10-CM | POA: Diagnosis not present

## 2019-07-24 DIAGNOSIS — M549 Dorsalgia, unspecified: Secondary | ICD-10-CM | POA: Diagnosis not present

## 2019-07-24 DIAGNOSIS — M2550 Pain in unspecified joint: Secondary | ICD-10-CM | POA: Diagnosis not present

## 2019-07-24 DIAGNOSIS — E663 Overweight: Secondary | ICD-10-CM | POA: Diagnosis not present

## 2019-08-09 DIAGNOSIS — F4322 Adjustment disorder with anxiety: Secondary | ICD-10-CM | POA: Diagnosis not present

## 2019-08-24 ENCOUNTER — Other Ambulatory Visit: Payer: Self-pay | Admitting: Obstetrics & Gynecology

## 2019-08-24 DIAGNOSIS — Z1231 Encounter for screening mammogram for malignant neoplasm of breast: Secondary | ICD-10-CM

## 2019-08-29 ENCOUNTER — Other Ambulatory Visit: Payer: Self-pay

## 2019-08-29 ENCOUNTER — Ambulatory Visit
Admission: RE | Admit: 2019-08-29 | Discharge: 2019-08-29 | Disposition: A | Discharge status: Home / Self Care | Payer: BC Managed Care – PPO | Source: Ambulatory Visit | Attending: Obstetrics & Gynecology | Admitting: Obstetrics & Gynecology

## 2019-08-29 DIAGNOSIS — Z1231 Encounter for screening mammogram for malignant neoplasm of breast: Secondary | ICD-10-CM
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
## 2019-10-18 ENCOUNTER — Other Ambulatory Visit: Payer: Self-pay

## 2019-10-19 ENCOUNTER — Ambulatory Visit (INDEPENDENT_AMBULATORY_CARE_PROVIDER_SITE_OTHER): Payer: PRIVATE HEALTH INSURANCE | Admitting: Obstetrics & Gynecology

## 2019-10-19 ENCOUNTER — Other Ambulatory Visit: Payer: Self-pay

## 2019-10-19 ENCOUNTER — Other Ambulatory Visit (HOSPITAL_COMMUNITY)
Admission: RE | Admit: 2019-10-19 | Discharge: 2019-10-19 | Disposition: A | Discharge status: Home / Self Care | Payer: No Typology Code available for payment source | Source: Ambulatory Visit | Attending: Obstetrics & Gynecology | Admitting: Obstetrics & Gynecology

## 2019-10-19 ENCOUNTER — Encounter: Payer: Self-pay | Admitting: Obstetrics & Gynecology

## 2019-10-19 VITALS — BP 110/70 | HR 68 | Temp 97.3°F | Resp 16 | Ht 69.75 in | Wt 188.0 lb

## 2019-10-19 DIAGNOSIS — Z124 Encounter for screening for malignant neoplasm of cervix: Secondary | ICD-10-CM | POA: Diagnosis present

## 2019-10-19 DIAGNOSIS — Z01419 Encounter for gynecological examination (general) (routine) without abnormal findings: Secondary | ICD-10-CM

## 2019-10-19 MED ORDER — NORETHINDRONE ACET-ETHINYL EST 1-20 MG-MCG PO TABS: 1.0000 | ORAL_TABLET | Freq: Every day | ORAL | 4 refills | Status: DC

## 2019-10-22 LAB — CYTOLOGY - PAP
Comment: NEGATIVE
Diagnosis: NEGATIVE
High risk HPV: NEGATIVE

## 2019-10-29 ENCOUNTER — Other Ambulatory Visit: Payer: Self-pay | Admitting: Obstetrics & Gynecology

## 2019-12-17 ENCOUNTER — Telehealth: Payer: Self-pay

## 2019-12-17 ENCOUNTER — Encounter: Payer: Self-pay | Admitting: Obstetrics & Gynecology

## 2019-12-17 NOTE — Telephone Encounter (Signed)
Left message to call Sharee Pimple, RN at [facility].   Call to patient to confirm dosage for prozac.  Per review of 10/19/19 AEX notes, patient will call for refill. Current Rx is Prozac 10 mg PO daily.

## 2019-12-17 NOTE — Telephone Encounter (Signed)
Khachatryan, Tashema H  P [facility] Hello,  Hope y'all are well. Could Dr Sabra Heck please write prescription for 20 mg of fluoxitine (generic Prozac) for me ?  We discussed at my last visit   Thanks  Cathyann  [phone]

## 2019-12-18 NOTE — Telephone Encounter (Signed)
Spoke with patient. Patient is requesting an Rx for fluoxetine 20 mg tab. Patient reports she has been taking 20mg  (Two 10 mg tabs) of fluoxetine daily for several months. Patient is requesting RX to [facility] [place] [facility] on file. Advised I will provide update to Dr. Sabra Heck and notify of recommendations. Patient agreeable.   Last AEX 10/19/19  Dr. Sabra Heck -please advise on Rx for fluoxetine 20 mg tab PO daily.  Rx pended.

## 2019-12-20 MED ORDER — FLUOXETINE HCL 20 MG PO TABS: 20.0000 mg | ORAL_TABLET | Freq: Every day | ORAL | 1 refills | Status: DC

## 2019-12-20 NOTE — Telephone Encounter (Signed)
Rx completed.  Ok to close encounter.   °

## 2019-12-20 NOTE — Telephone Encounter (Signed)
Patient notified of Rx.  

## 2020-10-13 ENCOUNTER — Other Ambulatory Visit: Payer: Self-pay | Admitting: Obstetrics & Gynecology

## 2020-10-13 DIAGNOSIS — Z1231 Encounter for screening mammogram for malignant neoplasm of breast: Secondary | ICD-10-CM

## 2020-10-23 ENCOUNTER — Other Ambulatory Visit: Payer: Self-pay | Admitting: Obstetrics & Gynecology

## 2020-10-24 NOTE — Telephone Encounter (Signed)
Rx completed for pt.  Can you call to make sure has appt scheduled?  Thanks.

## 2020-10-27 ENCOUNTER — Telehealth (HOSPITAL_BASED_OUTPATIENT_CLINIC_OR_DEPARTMENT_OTHER): Payer: Self-pay | Admitting: Obstetrics & Gynecology

## 2020-10-27 NOTE — Telephone Encounter (Signed)
Called patient and left message to call the office to schedule appointment .

## 2020-12-05 ENCOUNTER — Other Ambulatory Visit: Payer: Self-pay

## 2020-12-05 ENCOUNTER — Ambulatory Visit
Admission: RE | Admit: 2020-12-05 | Discharge: 2020-12-05 | Disposition: A | Discharge status: Home / Self Care | Payer: No Typology Code available for payment source | Source: Ambulatory Visit | Attending: Obstetrics & Gynecology | Admitting: Obstetrics & Gynecology

## 2020-12-05 DIAGNOSIS — Z1231 Encounter for screening mammogram for malignant neoplasm of breast: Secondary | ICD-10-CM

## 2020-12-11 ENCOUNTER — Other Ambulatory Visit: Payer: Self-pay | Admitting: Obstetrics & Gynecology

## 2020-12-11 DIAGNOSIS — R928 Other abnormal and inconclusive findings on diagnostic imaging of breast: Secondary | ICD-10-CM

## 2020-12-25 ENCOUNTER — Other Ambulatory Visit: Payer: Self-pay | Admitting: Obstetrics & Gynecology

## 2020-12-25 ENCOUNTER — Other Ambulatory Visit: Payer: Self-pay

## 2020-12-25 ENCOUNTER — Ambulatory Visit
Admission: RE | Admit: 2020-12-25 | Discharge: 2020-12-25 | Disposition: A | Discharge status: Home / Self Care | Payer: No Typology Code available for payment source | Source: Ambulatory Visit | Attending: Obstetrics & Gynecology | Admitting: Obstetrics & Gynecology

## 2020-12-25 DIAGNOSIS — R928 Other abnormal and inconclusive findings on diagnostic imaging of breast: Secondary | ICD-10-CM

## 2021-01-05 ENCOUNTER — Other Ambulatory Visit: Payer: Self-pay

## 2021-01-05 ENCOUNTER — Ambulatory Visit
Admission: RE | Admit: 2021-01-05 | Discharge: 2021-01-05 | Disposition: A | Discharge status: Home / Self Care | Payer: BC Managed Care – PPO | Source: Ambulatory Visit | Attending: Obstetrics & Gynecology | Admitting: Obstetrics & Gynecology

## 2021-01-05 DIAGNOSIS — R928 Other abnormal and inconclusive findings on diagnostic imaging of breast: Secondary | ICD-10-CM

## 2021-01-05 DIAGNOSIS — C50412 Malignant neoplasm of upper-outer quadrant of left female breast: Secondary | ICD-10-CM | POA: Diagnosis not present

## 2021-01-06 ENCOUNTER — Telehealth: Payer: Self-pay | Admitting: *Deleted

## 2021-01-06 ENCOUNTER — Encounter: Payer: Self-pay | Admitting: *Deleted

## 2021-01-06 NOTE — Telephone Encounter (Signed)
Left message for a return phone call to schedule appt for [place] 8/10.

## 2021-01-07 ENCOUNTER — Telehealth: Payer: Self-pay | Admitting: Oncology

## 2021-01-07 NOTE — Telephone Encounter (Signed)
LVM for patient to return call in reference to morning clinic appointment for 8/10

## 2021-01-08 ENCOUNTER — Other Ambulatory Visit: Payer: Self-pay | Admitting: *Deleted

## 2021-01-08 ENCOUNTER — Ambulatory Visit (HOSPITAL_BASED_OUTPATIENT_CLINIC_OR_DEPARTMENT_OTHER): Payer: No Typology Code available for payment source | Admitting: Obstetrics & Gynecology

## 2021-01-08 DIAGNOSIS — Z170 Estrogen receptor positive status [ER+]: Secondary | ICD-10-CM | POA: Insufficient documentation

## 2021-01-08 DIAGNOSIS — C50412 Malignant neoplasm of upper-outer quadrant of left female breast: Secondary | ICD-10-CM | POA: Insufficient documentation

## 2021-01-12 ENCOUNTER — Encounter: Payer: Self-pay | Admitting: *Deleted

## 2021-01-13 NOTE — Progress Notes (Signed)
Radiation Oncology         [phone] ________________________________  Initial Outpatient Consultation  Name: Renee [name] MRN: [id number]  Date: 01/14/2021  DOB: 01/11/1971  CC:Russo, John, MD     REFERRING PHYSICIAN: Matthew Tsuei MD  DIAGNOSIS:    ICD-10-CM   1. 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 female who presented with breast abnormality on the following imaging: bilateral mammogram on the date of 12/05/20. No symptoms, if any, were reported at the time. Ultrasound of breast on 12/25/20 revealed a suspicious 3.7 x 3.1 x 3.1 cm left breast mass at the 1 o'clock position. (No evidence of left axillary lymphadenopathy was visualized).  Left breast UOQ biopsy on date of 01/05/21 showed invasive ductal carcinoma with extracellular mucin; measuring 3 mm (fragmented) in the greatest linear extent. ER status: 95%, positive; PR status 90%, positive; both with strong staining intensity, Her2 status negative; Ki67: 20%; Grade 2.  She has a significant family history and is seriously considering bilateral mastectomies.  Genetic testing is pending.  She is here with her significant other who appears very supportive.  PREVIOUS RADIATION THERAPY: Yes she reports previous radioactive iodine pill for thyroid cancer  PAST MEDICAL HISTORY:  has a past medical history of Anxiety, Breast cancer (HCC), Cancer (HCC) (2013 Thyroid), Depression, Endometrial polyp (01/14/2016), Family history of breast cancer (01/14/2021), Fibroid, uterine, History of thyroid cancer (01/14/2021), Infertility, female, Thyroid cancer (HCC) (2013), and Urinary incontinence.    PAST SURGICAL HISTORY: Past  Surgical History:  Procedure Laterality Date   HYSTEROSCOPY WITH D & C  10/13/2017   polyp resection   TOTAL THYROIDECTOMY  2013    FAMILY HISTORY: family history includes Breast cancer in an other family member; Breast cancer (age of onset: 60) in her mother; Cancer in her mother; Diabetes in her father; Skin cancer in her maternal grandfather and sister; Vision loss in her mother.  SOCIAL HISTORY:  reports that she has never smoked. She has never used smokeless tobacco. She reports current alcohol use of about 1.0 - 2.0 standard drink of alcohol per week. She reports that she does not use drugs.  ALLERGIES: Erythromycin  MEDICATIONS:  Current Outpatient Medications  Medication Sig Dispense Refill   ADDERALL XR 10 MG 24 hr capsule Take 1 capsule by mouth daily.  0   FLUoxetine (PROZAC) 20 MG tablet Take 1 tablet (20 mg total) by mouth daily. 90 tablet 1   levothyroxine (SYNTHROID, LEVOTHROID) 125 MCG tablet Take 1 tablet by mouth daily. For 2 days a week  3   levothyroxine (SYNTHROID, LEVOTHROID) 137 MCG tablet Take 1 tablet by mouth daily. For 5 days a week  5   MICROGESTIN 1-20 MG-MCG tablet TAKE 1 TABLET BY MOUTH ONCE DAILY, TAKE CONTINUOUSLY 84 tablet 1   Multiple Vitamin (MULTIVITAMIN PO) Take by mouth.     No current facility-administered medications for this encounter.    REVIEW OF SYSTEMS: As above   PHYSICAL EXAM:   Vitals with BMI 01/14/2021  Height 5' 9.75"  Weight 206 lbs  BMI 29.76  Systolic 145  Diastolic 63  Pulse 78   General: Alert and oriented, in   no acute distress Heart: Regular in rate and rhythm with no murmurs, rubs, or gallops. Chest: Clear to auscultation bilaterally, with no rhonchi, wheezes, or rales. Abdomen: Soft, nontender, nondistended, with no rigidity or guarding. Extremities: No cyanosis or edema. Lymphatics: see Neck Exam Skin: No concerning lesions. Musculoskeletal: symmetric strength and muscle tone throughout. Neurologic: Cranial  nerves II through XII are grossly intact. No obvious focalities. Speech is fluent. Coordination is intact. Psychiatric: Judgment and insight are intact. Affect is appropriate. Breasts: Left breast is notable for 4 cm mass in the upper outer quadrant. No other palpable masses appreciated in the breasts or axillae bilaterally.    ECOG = 0  0 - Asymptomatic (Fully active, able to carry on all predisease activities without restriction)  1 - Symptomatic but completely ambulatory (Restricted in physically strenuous activity but ambulatory and able to carry out work of a light or sedentary nature. For example, light housework, office work)  2 - Symptomatic, <50% in bed during the day (Ambulatory and capable of all self care but unable to carry out any work activities. Up and about more than 50% of waking hours)  3 - Symptomatic, >50% in bed, but not bedbound (Capable of only limited self-care, confined to bed or chair 50% or more of waking hours)  4 - Bedbound (Completely disabled. Cannot carry on any self-care. Totally confined to bed or chair)  5 - Death   Oken MM, Creech RH, Tormey DC, et al. (1982). "Toxicity and response criteria of the Eastern Cooperative Oncology Group". Am. J. Clin. Oncol. 5 (6): 649-55   LABORATORY DATA:  Lab Results  Component Value Date   WBC 8.3 01/14/2021   HGB 14.3 01/14/2021   HCT 41.7 01/14/2021   MCV 90.5 01/14/2021   PLT 250 01/14/2021   CMP     Component Value Date/Time   NA 136 01/14/2021 0816   K 4.2 01/14/2021 0816   CL 104 01/14/2021 0816   CO2 23 01/14/2021 0816   GLUCOSE 95 01/14/2021 0816   BUN 12 01/14/2021 0816   CREATININE 0.78 01/14/2021 0816   CALCIUM 9.5 01/14/2021 0816   PROT 7.0 01/14/2021 0816   ALBUMIN 3.6 01/14/2021 0816   AST 16 01/14/2021 0816   ALT 21 01/14/2021 0816   ALKPHOS 72 01/14/2021 0816   BILITOT 0.4 01/14/2021 0816   GFRNONAA >60 01/14/2021 0816         RADIOGRAPHY: US BREAST LTD UNI LEFT INC  AXILLA  Result Date: 12/25/2020 CLINICAL DATA:  50-year-old female presenting for screening recall for a left breast mass. The patient has family history of breast cancer in her mother and her maternal aunt. EXAM: ULTRASOUND OF THE LEFT BREAST COMPARISON:  Previous exam(s). FINDINGS: Ultrasound targeted to the left breast at 1 o'clock, 2 cm from the nipple demonstrates an oval heterogeneous mass with indistinct margins and significant vascularity. The mass measures 3.7 x 3.1 x 3.1 cm. Multiple normal-appearing lymph nodes are identified in the left axilla. IMPRESSION: 1.  Suspicious 3.7 cm left breast mass at 1 o'clock. 2.  No evidence of left axillary lymphadenopathy. RECOMMENDATION: 1. Ultrasound-guided biopsy is recommended for the left breast mass. This has been scheduled for 01/05/2021 at 7:30 a.m. 2. If the pathology results demonstrate malignancy, MRI is suggested given the patient's breast tissue density, age and family history. I have discussed the findings and recommendations with the patient. If applicable, a reminder letter will be sent to the patient regarding the next appointment. BI-RADS CATEGORY  5:   Highly suggestive of malignancy. Electronically Signed   By: Michelle  Collins M.D.   On: 12/25/2020 08:25  MM CLIP PLACEMENT LEFT  Result Date: 01/05/2021 CLINICAL DATA:  Status post ultrasound-guided core biopsy of a left breast mass. EXAM: 3D DIAGNOSTIC LEFT MAMMOGRAM POST ULTRASOUND BIOPSY COMPARISON:  Previous exam(s). FINDINGS: 3D Mammographic images were obtained following ultrasound guided biopsy of a mass in the left breast. The biopsy marking clip is in the upper-outer quadrant of the left breast. IMPRESSION: Appropriate positioning of the ribbon shaped biopsy marking clip at the site of biopsy in the upper-outer quadrant of the left breast. Final Assessment: Post Procedure Mammograms for Marker Placement Electronically Signed   By: Dina  Arceo M.D.   On: 01/05/2021 08:37  US LT BREAST BX  W LOC DEV 1ST LESION IMG BX SPEC US GUIDE  Addendum Date: 01/06/2021   ADDENDUM REPORT: 01/06/2021 11:16 ADDENDUM: Pathology revealed GRADE II INVASIVE DUCTAL CARCINOMA WITH EXTRACELLULAR MUCIN of the Left breast, upper outer quadrant, (ribbon clip). This was found to be concordant by Dr. Dina Arceo. Pathology results were discussed with the patient by telephone. The patient reported doing well after the biopsy with tenderness at the site. Post biopsy instructions and care were reviewed and questions were answered. The patient was encouraged to call [facility] for any additional concerns. My direct phone number was provided. The patient was referred to [facility] on January 14, 2021. Recommendation for a bilateral breast MRI given the patient's breast tissue density, age and family history. Pathology results reported by Lynne Bailey, RN on 01/06/2021. Electronically Signed   By: Dina  Arceo M.D.   On: 01/06/2021 11:16   Result Date: 01/06/2021 CLINICAL DATA:  Left breast mass. EXAM: ULTRASOUND GUIDED LEFT BREAST CORE NEEDLE BIOPSY COMPARISON:  Previous exam(s). PROCEDURE: I met with the patient and we discussed the procedure of ultrasound-guided biopsy, including benefits and alternatives. We discussed the high likelihood of a successful procedure. We discussed the risks of the procedure, including infection, bleeding, tissue injury, clip migration, and inadequate sampling. Informed written consent was given. The usual time-out protocol was performed immediately prior to the procedure. Lesion quadrant: Upper-outer quadrant Using sterile technique and 1% Lidocaine as local anesthetic, under direct ultrasound visualization, a 14 gauge spring-loaded device was used to perform biopsy of a mass in the 1 o'clock region of the left breast using a inferior to superior approach. At the conclusion of the procedure ribbon  shaped tissue marker clip was deployed into the biopsy cavity. Follow up 2 view mammogram was performed and dictated separately. IMPRESSION: Ultrasound guided biopsy of the left breast. No apparent complications. Electronically Signed: By: Dina  Arceo M.D. On: 01/05/2021 08:15     IMPRESSION/PLAN: Left breast cancer  She is strongly considering bilateral mastectomies.  She understands that if her disease is more advanced than anticipated she may still need postmastectomy radiation for a good chance of cure.  If she undergoes breast conserving surgery I will definitely recommend post surgical radiation for local control.  She is still digesting all of the information that she received today and making her decision about her surgical preference.  Genetic testing is pending.  It was a pleasure meeting the patient today. We discussed the risks, benefits, and side effects of radiotherapy.   We discussed that radiation to the left breast would take approximately 4-6 weeks to complete and that I would give the patient   a few weeks to heal following surgery before starting treatment planning.  If chemotherapy were to be given, this would precede radiotherapy. We spoke about acute effects including skin irritation and fatigue as well as much less common late effects including internal organ injury or irritation. We spoke about the latest technology that is used to minimize the risk of late effects for patients undergoing radiotherapy to the breast or chest wall. No guarantees of treatment were given. The patient is enthusiastic about proceeding with treatment if warranted. I look forward to participating in the patient's care.  I will await her referral back to me for postoperative follow-up and eventual CT simulation/treatment planning if warranted, depending on her choice of surgery and final pathology.  I wished her the very best.  On date of service, in total, I spent 50 minutes on this encounter. Patient was  seen in person.   __________________________________________   Eppie Gibson, MD  This document serves as a record of services personally performed by Eppie Gibson, MD. It was created on her behalf by Roney Mans, a trained medical scribe. The creation of this record is based on the scribe's personal observations and the provider's statements to them. This document has been checked and approved by the attending provider.

## 2021-01-13 NOTE — Progress Notes (Signed)
[place]  [phone] [phone]     ID: Renee [name] DOB: 01-08-1971  MR#: [id number]  WCB#:[id number]  Patient Care Team: Shon Baton, MD as PCP - General (Internal Medicine) Rockwell Germany, RN as Oncology Nurse Navigator Mauro Kaufmann, RN as Oncology Nurse Navigator Donnie Mesa, MD as Consulting Physician (General Surgery) Sascha Baugher, Virgie Dad, MD as Consulting Physician (Oncology) Eppie Gibson, MD as Attending Physician (Radiation Oncology) Hollar, Katharine Look, MD as Referring Physician (Dermatology) Aurea Graff OTHER MD:  CHIEF COMPLAINT: Estrogen receptor positive breast cancer  CURRENT TREATMENT: Consider neoadjuvant chemotherapy   HISTORY OF CURRENT ILLNESS: Renee [name] ("sir'noi-YEH-vitch") had routine screening mammography on 12/05/2020 showing a possible abnormality in the left breast. She underwent left breast ultrasonography at [facility] on 12/25/2020 showing: suspicious 3.7 cm mass in left breast at 1 o'clock; no evidence of left axillary lymphadenopathy.  Accordingly on 01/05/2021 she proceeded to biopsy of the left breast area in question. The pathology from this procedure ([id number]) showed: invasive ductal carcinoma, grade 2, with extracellular mucin. Prognostic indicators significant for: estrogen receptor, 95% positive and progesterone receptor, 90% positive, both with strong staining intensity. Proliferation marker Ki67 at 20%. HER2 equivocal by immunohistochemistry (2+), but negative by fluorescent in situ hybridization with a signals ratio 1.26 and number per cell 2.15.  Cancer Staging Malignant neoplasm of upper-outer quadrant of left breast in female, estrogen receptor positive ([place]) Staging form: Breast, AJCC 8th Edition - Clinical: Stage IB (cT2, cN0, cM0, G2, ER+, PR+, HER2-) - Signed by Chauncey Cruel, MD on 01/14/2021 Histologic grading system: 3 grade system  The patient has  a history of thyroid cancer status post total thyroidectomy and lymph node sampling (with positive lymph nodes) 2013 in [place].  She will bring the pathology report for further review at a different time  The patient's subsequent history is as detailed below.   INTERVAL HISTORY: Renee [name] was evaluated in the multidisciplinary breast cancer clinic on 01/14/2021 accompanied by her husband Renee [name]. Her case was also presented at the multidisciplinary breast cancer conference on the same day. At that time a preliminary plan was proposed: Consider MammaPrint from original biopsy and if high risk proceed neoadjuvant treatment; if low risk start with surgery; consider plastics if lumpectomy planned; adjuvant radiation/genetics/antiestrogens   REVIEW OF SYSTEMS: There were no specific symptoms leading to the original mammogram, which was routinely scheduled. On the provided questionnaire, Renee [name] reports wearing glasses, sinus problems, back and joint pain, and anxiety. The patient denies unusual headaches, visual changes, nausea, vomiting, stiff neck, dizziness, or gait imbalance. There has been no cough, phlegm production, or pleurisy, no chest pain or pressure, and no change in bowel or bladder habits. The patient denies fever, rash, bleeding, unexplained fatigue or unexplained weight loss. A detailed review of systems was otherwise entirely negative.   COVID 19 VACCINATION STATUS: refuses vaccination, infection 07/2020   PAST MEDICAL HISTORY: Past Medical History:  Diagnosis Date   Anxiety    Cancer ([place]) 2013 Thyroid   Depression    Endometrial polyp 01/14/2016   Fibroid, uterine    Infertility, female    Thyroid cancer ([place]) 2013   Urinary incontinence   Her thyroid cancer was diagnosed in 2013 in [place]. (She has a copy of her pathology report)   PAST SURGICAL HISTORY: Past Surgical History:  Procedure Laterality Date   HYSTEROSCOPY WITH D & C  10/13/2017   polyp resection    TOTAL THYROIDECTOMY  2013    FAMILY HISTORY: Family History  Problem Relation Age of Onset   Breast cancer Mother 105       Double Mastectomy    Cancer Mother    Vision loss Mother    Diabetes Father    Breast cancer Other        maternal great aunt   Her father is 45 and mother age 63, as of 01/2021. Renee [name] has one sister (and no brothers). Her mother, Renee [name]. Renee [name], was my patient for breast cancer.  The patientalso reports breast cancer in a maternal great-aunt (mother [name]'s aunt).   GYNECOLOGIC HISTORY:  No LMP recorded. (Menstrual status: Oral contraceptives). Menarche: 50 years old Age at first live birth: 50 years old [place] P 1 LMP 4+ years ago due to continuous birth control use Contraceptive: current use HRT n/a  Hysterectomy? no BSO? 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 Husband Renee [name] is vice Radio producer in [employer].. She lives at home with husband Renee [name] and their son Renee [name], age 53. She attends [employer]    ADVANCED DIRECTIVES: In the absence of any documentation to the contrary, the patient's spouse is their HCPOA.    HEALTH MAINTENANCE: Social History   Tobacco Use   Smoking status: Never   Smokeless tobacco: Never  Vaping Use   Vaping Use: Never used  Substance Use Topics   Alcohol use: Yes    Alcohol/week: 1.0 - 2.0 standard drink    Types: 1 Glasses of wine per week   Drug use: No     Colonoscopy: never done  PAP: 10/2019, negative  Bone density: n/a (age)   Allergies  Allergen Reactions   Erythromycin Other (See Comments)    Childhood    Current Outpatient Medications  Medication Sig Dispense Refill   ADDERALL XR 10 MG 24 hr capsule Take 1 capsule by mouth daily.  0   FLUoxetine (PROZAC) 20 MG tablet Take 1 tablet (20 mg total) by mouth daily. 90 tablet 1   levothyroxine (SYNTHROID, LEVOTHROID) 125 MCG tablet Take 1 tablet  by mouth daily. For 2 days a week  3   levothyroxine (SYNTHROID, LEVOTHROID) 137 MCG tablet Take 1 tablet by mouth daily. For 5 days a week  5   MICROGESTIN 1-20 MG-MCG tablet TAKE 1 TABLET BY MOUTH ONCE DAILY, TAKE CONTINUOUSLY 84 tablet 1   Multiple Vitamin (MULTIVITAMIN PO) Take by mouth.     No current facility-administered medications for this visit.    OBJECTIVE: White woman who appears stated age  50:   01/14/21 0837  BP: (!) 145/63  Pulse: 78  Resp: 18  Temp: 97.7 F (36.5 C)  SpO2: 98%     Body mass index is 29.77 kg/m.   Wt Readings from Last 3 Encounters:  01/14/21 206 lb (93.4 kg)  10/19/19 188 lb (85.3 kg)  01/10/18 203 lb 6.4 oz (92.3 kg)      ECOG FS:1 - Symptomatic but completely ambulatory  Ocular: Sclerae unicteric, pupils round and equal Ear-nose-throat: Wearing a mask Lymphatic: No cervical or supraclavicular adenopathy Lungs no rales or rhonchi Heart regular rate and rhythm Abd soft, nontender, positive bowel sounds MSK no focal spinal tenderness, no joint edema Neuro: non-focal, well-oriented, appropriate affect Breasts: The right breast is benign.  The left breast is status post recent biopsy.  There is an easily palpable mass which is movable, does  not involve the skin, does not involve the nipple.  The left breast and both axillae are benign.   LAB RESULTS:  CMP     Component Value Date/Time   NA 136 01/14/2021 0816   K 4.2 01/14/2021 0816   CL 104 01/14/2021 0816   CO2 23 01/14/2021 0816   GLUCOSE 95 01/14/2021 0816   BUN 12 01/14/2021 0816   CREATININE 0.78 01/14/2021 0816   CALCIUM 9.5 01/14/2021 0816   PROT 7.0 01/14/2021 0816   ALBUMIN 3.6 01/14/2021 0816   AST 16 01/14/2021 0816   ALT 21 01/14/2021 0816   ALKPHOS 72 01/14/2021 0816   BILITOT 0.4 01/14/2021 0816   GFRNONAA >60 01/14/2021 0816    No results found for: TOTALPROTELP, ALBUMINELP, A1GS, A2GS, BETS, BETA2SER, GAMS, MSPIKE, SPEI  Lab Results  Component Value  Date   WBC 8.3 01/14/2021   NEUTROABS 5.7 01/14/2021   HGB 14.3 01/14/2021   HCT 41.7 01/14/2021   MCV 90.5 01/14/2021   PLT 250 01/14/2021    No results found for: LABCA2  No components found for: WUXLKG401  No results for input(s): INR in the last 168 hours.  No results found for: LABCA2  No results found for: UUV253  No results found for: GUY403  No results found for: KVQ259  No results found for: CA2729  No components found for: HGQUANT  No results found for: CEA1 / No results found for: CEA1   No results found for: AFPTUMOR  No results found for: CHROMOGRNA  No results found for: KPAFRELGTCHN, LAMBDASER, KAPLAMBRATIO (kappa/lambda light chains)  No results found for: HGBA, HGBA2QUANT, HGBFQUANT, HGBSQUAN (Hemoglobinopathy evaluation)   No results found for: LDH  No results found for: IRON, TIBC, IRONPCTSAT (Iron and TIBC)  No results found for: FERRITIN  Urinalysis No results found for: COLORURINE, APPEARANCEUR, LABSPEC, PHURINE, GLUCOSEU, HGBUR, BILIRUBINUR, KETONESUR, PROTEINUR, UROBILINOGEN, NITRITE, LEUKOCYTESUR   STUDIES: US BREAST LTD UNI LEFT INC AXILLA  Result Date: 12/25/2020 CLINICAL DATA:  50 year old female presenting for screening recall for a left breast mass. The patient has family history of breast cancer in her mother and her maternal aunt. EXAM: ULTRASOUND OF THE LEFT BREAST COMPARISON:  Previous exam(s). FINDINGS: Ultrasound targeted to the left breast at 1 o'clock, 2 cm from the nipple demonstrates an oval heterogeneous mass with indistinct margins and significant vascularity. The mass measures 3.7 x 3.1 x 3.1 cm. Multiple normal-appearing lymph nodes are identified in the left axilla. IMPRESSION: 1.  Suspicious 3.7 cm left breast mass at 1 o'clock. 2.  No evidence of left axillary lymphadenopathy. RECOMMENDATION: 1. Ultrasound-guided biopsy is recommended for the left breast mass. This has been scheduled for 01/05/2021 at 7:30 a.m. 2. If  the pathology results demonstrate malignancy, MRI is suggested given the patient's breast tissue density, age and family history. I have discussed the findings and recommendations with the patient. If applicable, a reminder letter will be sent to the patient regarding the next appointment. BI-RADS CATEGORY  5: Highly suggestive of malignancy. Electronically Signed   By: Ammie Ferrier M.D.   On: 12/25/2020 08:25  MM CLIP PLACEMENT LEFT  Result Date: 01/05/2021 CLINICAL DATA:  Status post ultrasound-guided core biopsy of a left breast mass. EXAM: 3D DIAGNOSTIC LEFT MAMMOGRAM POST ULTRASOUND BIOPSY COMPARISON:  Previous exam(s). FINDINGS: 3D Mammographic images were obtained following ultrasound guided biopsy of a mass in the left breast. The biopsy marking clip is in the upper-outer quadrant of the left breast. IMPRESSION: Appropriate positioning of the  ribbon shaped biopsy marking clip at the site of biopsy in the upper-outer quadrant of the left breast. Final Assessment: Post Procedure Mammograms for Marker Placement Electronically Signed   By: Lillia Mountain M.D.   On: 01/05/2021 08:37  [place] LT BREAST BX W LOC DEV 1ST LESION IMG BX SPEC US GUIDE  Addendum Date: 01/06/2021   ADDENDUM REPORT: 01/06/2021 11:16 ADDENDUM: Pathology revealed GRADE II INVASIVE DUCTAL CARCINOMA WITH EXTRACELLULAR MUCIN of the Left breast, upper outer quadrant, (ribbon clip). This was found to be concordant by Dr. Lillia Mountain. Pathology results were discussed with the patient by telephone. The patient reported doing well after the biopsy with tenderness at the site. Post biopsy instructions and care were reviewed and questions were answered. The patient was encouraged to call [facility] for any additional concerns. My direct phone number was provided. The patient was referred to [facility] on January 14, 2021. Recommendation for a  bilateral breast MRI given the patient's breast tissue density, age and family history. Pathology results reported by Terie Purser, RN on 01/06/2021. Electronically Signed   By: Lillia Mountain M.D.   On: 01/06/2021 11:16   Result Date: 01/06/2021 CLINICAL DATA:  Left breast mass. EXAM: ULTRASOUND GUIDED LEFT BREAST CORE NEEDLE BIOPSY COMPARISON:  Previous exam(s). PROCEDURE: I met with the patient and we discussed the procedure of ultrasound-guided biopsy, including benefits and alternatives. We discussed the high likelihood of a successful procedure. We discussed the risks of the procedure, including infection, bleeding, tissue injury, clip migration, and inadequate sampling. Informed written consent was given. The usual time-out protocol was performed immediately prior to the procedure. Lesion quadrant: Upper-outer quadrant Using sterile technique and 1% Lidocaine as local anesthetic, under direct ultrasound visualization, a 14 gauge spring-loaded device was used to perform biopsy of a mass in the 1 o'clock region of the left breast using a inferior to superior approach. At the conclusion of the procedure ribbon shaped tissue marker clip was deployed into the biopsy cavity. Follow up 2 view mammogram was performed and dictated separately. IMPRESSION: Ultrasound guided biopsy of the left breast. No apparent complications. Electronically Signed: By: Lillia Mountain M.D. On: 01/05/2021 08:15    ELIGIBLE FOR AVAILABLE RESEARCH PROTOCOL: no  ASSESSMENT: 50 y.o. [place] woman status post left breast upper outer quadrant biopsy 01/05/2021 for a clinical T2 N0, stage IB invasive ductal carcinoma, grade 2, with mucinous features, estrogen and progesterone receptor positive, HER2 not amplified, with an MIB-1 of 20%  (1) genetics testing  (2) MammaPrint to be obtained from the original biopsy  (3) consider neoadjuvant chemotherapy if high risk  (4) definitive surgery pending  (5) adjuvant radiation  (6)  antiestrogens   PLAN: I met today with Liala to review her new diagnosis. Specifically we discussed the biology of her breast cancer, its diagnosis, staging, treatment  options and prognosis. We first reviewed the fact that cancer is not one disease but more than 100 different diseases and that it is important to keep them separate-- otherwise when friends and relatives discuss their own cancer experiences with [place] confusion can result. Similarly we explained that if breast cancer spreads to the bone or liver, the patient would not have bone cancer or liver cancer, but breast cancer in the bone and breast cancer in the liver: one cancer in three places-- not 3 different cancers which otherwise would have to be treated in 3 different ways.  We discussed the difference between local and systemic therapy. In terms of loco-regional treatment, lumpectomy plus radiation is equivalent to mastectomy as far as survival is concerned. For this reason, and because the cosmetic results are generally superior, we recommend breast conserving surgery.   We then discussed the rationale for systemic therapy. There is some risk that this cancer may have already spread to other parts of her body. Patients frequently ask at this point about bone scans, CAT scans and PET scans to find out if they have occult breast cancer somewhere else. The problem is that in early stage disease we are much more likely to find false positives then true cancers and this would expose the patient to unnecessary procedures as well as unnecessary radiation. Scans cannot answer the question the patient really would like to know, which is whether she has microscopic disease elsewhere in her body. For those reasons we do not recommend them.  Of course we would proceed to aggressive evaluation of any symptoms that might suggest metastatic disease, but that is not the case here.  Next we went over the options for systemic therapy which are  anti-estrogens, anti-HER-2 immunotherapy, and chemotherapy. Lailie does not meet criteria for anti-HER-2 immunotherapy. She is a good candidate for anti-estrogens.  The question of chemotherapy is more complicated. Chemotherapy is most effective in rapidly growing, aggressive tumors. It is much less effective in low-grade, slow growing cancers. Ricki 's is exactly in between.  For that reason we are going to request a MammaPrint from the definitive surgical sample, as suggested by NCCN guidelines. That will help [place] make a definitive decision regarding chemotherapy in this case.  If she does require chemotherapy we would consider doxorubicin and cyclophosphamide in dose dense fashion x4 followed by paclitaxel weekly x12.  Laurianne does qualify for genetics testing. In patients who carry a deleterious mutation [for example in a  BRCA gene], the risk of a new breast cancer developing in the future may be sufficiently great that the patient may choose bilateral mastectomies. However if she wishes to keep her breasts in that situation it is safe to do so. That would require intensified screening, which generally means not only yearly mammography but a yearly breast MRI as well.   Delle has a good understanding of the overall plan. She agrees with it. She knows the goal of treatment in her case is cure. She will call with any problems that may develop before her next visit here.  Total encounter time 65 minutes.Sarajane Jews C. Quantae Martel, MD 01/14/2021 9:17 AM Medical Oncology and Hematology [facility] [place], [place] [address] Tel. [phone]    Fax. [phone]   This document serves as a record of services personally performed by Lurline Del, MD. It was created on his behalf by Wilburn Mylar, a trained medical scribe. The creation of this record is based on the scribe's personal observations and the provider's statements to them.   I, Lurline Del MD,  have reviewed the above documentation for accuracy and completeness, and I agree with the above.    *Total Encounter Time as defined by the Centers for Medicare and Medicaid Services includes, in addition to the face-to-face time of a patient visit (documented in the note above) non-face-to-face time: obtaining and reviewing outside history, ordering and reviewing medications, tests or procedures, care coordination (communications with other health care professionals or caregivers) and documentation in the medical record.

## 2021-01-14 ENCOUNTER — Inpatient Hospital Stay: Payer: BC Managed Care – PPO | Attending: Oncology | Admitting: Oncology

## 2021-01-14 ENCOUNTER — Encounter: Payer: Self-pay | Admitting: *Deleted

## 2021-01-14 ENCOUNTER — Ambulatory Visit: Payer: Self-pay | Admitting: Surgery

## 2021-01-14 ENCOUNTER — Other Ambulatory Visit: Payer: Self-pay

## 2021-01-14 ENCOUNTER — Ambulatory Visit
Admission: RE | Admit: 2021-01-14 | Discharge: 2021-01-14 | Disposition: A | Discharge status: Home / Self Care | Payer: BC Managed Care – PPO | Source: Ambulatory Visit | Attending: Radiation Oncology | Admitting: Radiation Oncology

## 2021-01-14 ENCOUNTER — Inpatient Hospital Stay: Payer: BC Managed Care – PPO

## 2021-01-14 ENCOUNTER — Telehealth: Payer: Self-pay | Admitting: *Deleted

## 2021-01-14 ENCOUNTER — Inpatient Hospital Stay: Payer: BC Managed Care – PPO | Admitting: Licensed Clinical Social Worker

## 2021-01-14 ENCOUNTER — Encounter: Payer: Self-pay | Admitting: Genetic Counselor

## 2021-01-14 ENCOUNTER — Encounter: Payer: Self-pay | Admitting: Radiation Oncology

## 2021-01-14 ENCOUNTER — Ambulatory Visit (HOSPITAL_BASED_OUTPATIENT_CLINIC_OR_DEPARTMENT_OTHER): Payer: BC Managed Care – PPO | Admitting: Genetic Counselor

## 2021-01-14 ENCOUNTER — Encounter: Payer: Self-pay | Admitting: Oncology

## 2021-01-14 VITALS — BP 145/63 | HR 78 | Temp 97.7°F | Resp 18 | Ht 69.75 in | Wt 206.0 lb

## 2021-01-14 DIAGNOSIS — Z170 Estrogen receptor positive status [ER+]: Secondary | ICD-10-CM

## 2021-01-14 DIAGNOSIS — Z803 Family history of malignant neoplasm of breast: Secondary | ICD-10-CM | POA: Diagnosis not present

## 2021-01-14 DIAGNOSIS — C50412 Malignant neoplasm of upper-outer quadrant of left female breast: Secondary | ICD-10-CM

## 2021-01-14 DIAGNOSIS — Z79899 Other long term (current) drug therapy: Secondary | ICD-10-CM | POA: Insufficient documentation

## 2021-01-14 DIAGNOSIS — Z85850 Personal history of malignant neoplasm of thyroid: Secondary | ICD-10-CM | POA: Diagnosis not present

## 2021-01-14 DIAGNOSIS — Z9013 Acquired absence of bilateral breasts and nipples: Secondary | ICD-10-CM | POA: Diagnosis not present

## 2021-01-14 HISTORY — DX: Personal history of malignant neoplasm of thyroid: Z85.850

## 2021-01-14 HISTORY — DX: Family history of malignant neoplasm of breast: Z80.3

## 2021-01-14 LAB — CBC WITH DIFFERENTIAL (CANCER CENTER ONLY)
Abs Immature Granulocytes: 0.03 10*3/uL (ref 0.00–0.07)
Basophils Absolute: 0.1 10*3/uL (ref 0.0–0.1)
Basophils Relative: 1 %
Eosinophils Absolute: 0.1 10*3/uL (ref 0.0–0.5)
Eosinophils Relative: 1 %
HCT: 41.7 % (ref 36.0–46.0)
Hemoglobin: 14.3 g/dL (ref 12.0–15.0)
Immature Granulocytes: 0 %
Lymphocytes Relative: 23 %
Lymphs Abs: 2 10*3/uL (ref 0.7–4.0)
MCH: 31 pg (ref 26.0–34.0)
MCHC: 34.3 g/dL (ref 30.0–36.0)
MCV: 90.5 fL (ref 80.0–100.0)
Monocytes Absolute: 0.4 10*3/uL (ref 0.1–1.0)
Monocytes Relative: 5 %
Neutro Abs: 5.7 10*3/uL (ref 1.7–7.7)
Neutrophils Relative %: 70 %
Platelet Count: 250 10*3/uL (ref 150–400)
RBC: 4.61 MIL/uL (ref 3.87–5.11)
RDW: 11.9 % (ref 11.5–15.5)
WBC Count: 8.3 10*3/uL (ref 4.0–10.5)
nRBC: 0 % (ref 0.0–0.2)

## 2021-01-14 LAB — CMP (CANCER CENTER ONLY)
ALT: 21 U/L (ref 0–44)
AST: 16 U/L (ref 15–41)
Albumin: 3.6 g/dL (ref 3.5–5.0)
Alkaline Phosphatase: 72 U/L (ref 38–126)
Anion gap: 9 (ref 5–15)
BUN: 12 mg/dL (ref 6–20)
CO2: 23 mmol/L (ref 22–32)
Calcium: 9.5 mg/dL (ref 8.9–10.3)
Chloride: 104 mmol/L (ref 98–111)
Creatinine: 0.78 mg/dL (ref 0.44–1.00)
GFR, Estimated: >60 mL/min (ref >60)
Glucose, Bld: 95 mg/dL (ref 70–99)
Potassium: 4.2 mmol/L (ref 3.5–5.1)
Sodium: 136 mmol/L (ref 135–145)
Total Bilirubin: 0.4 mg/dL (ref 0.3–1.2)
Total Protein: 7 g/dL (ref 6.5–8.1)

## 2021-01-14 LAB — GENETIC SCREENING ORDER

## 2021-01-14 NOTE — Progress Notes (Signed)
REFERRING PROVIDER: Chauncey Cruel, MD [address] [place],  [place] [address]  PRIMARY PROVIDER:  Shon Baton, MD  PRIMARY REASON FOR VISIT:  1. Malignant neoplasm of upper-outer quadrant of left breast in female, estrogen receptor positive ([facility])   2. History of thyroid cancer   3. Family history of breast cancer     HISTORY OF PRESENT ILLNESS:   Renee [name], a 50 y.o. female, was seen for a [facility] cancer genetics consultation at the request of Dr. Jana Hakim due to a personal and family history of cancer.  Renee [name] presents to clinic today to discuss the possibility of a hereditary predisposition to cancer, to discuss genetic testing, and to further clarify her future cancer risks, as well as potential cancer risks for family members.   In August 2022, at the age of 77, Renee [name] was diagnosed with invasive ductal carcinoma of the left breast (ER+/PR+/HER2-). The preliminary treatment plan includes consideration for neoadjuvant chemotherapy, surgery, adjuvant radiation, and anti-estrogens.  She also has a history of papillary thyroid cancer diagnosed at at 89 s/p thyroidectomy.   CANCER HISTORY:  Oncology History  Malignant neoplasm of upper-outer quadrant of left breast in female, estrogen receptor positive ([facility])  01/08/2021 Initial Diagnosis   Malignant neoplasm of upper-outer quadrant of left breast in female, estrogen receptor positive ([facility])    01/14/2021 Cancer Staging   Staging form: Breast, AJCC 8th Edition - Clinical: Stage IB (cT2, cN0, cM0, G2, ER+, PR+, HER2-) - Signed by Chauncey Cruel, MD on 01/14/2021  Histologic grading system: 3 grade system      RISK FACTORS:  Menarche was at age 70.  First live birth at age 42.  OCP use for approximately 2 years.  Ovaries intact: yes.  Hysterectomy: no.  HRT use: 0 years. Colonoscopy: no; not examined. Mammogram within the last year: yes. Up to date with pelvic exams: most recent PAP in August  2021.  Past Medical History:  Diagnosis Date   Anxiety    Breast cancer ([facility])    Cancer ([facility]) 2013 Thyroid   Depression    Endometrial polyp 01/14/2016   Family history of breast cancer 01/14/2021   Fibroid, uterine    History of thyroid cancer 01/14/2021   Infertility, female    Thyroid cancer ([facility]) 2013   Urinary incontinence     Past Surgical History:  Procedure Laterality Date   HYSTEROSCOPY WITH D & C  10/13/2017   polyp resection   TOTAL THYROIDECTOMY  2013    Social History   Socioeconomic History   Marital status: Married    Spouse name: Not on file   Number of children: Not on file   Years of education: Not on file   Highest education level: Not on file  Occupational History   Not on file  Tobacco Use   Smoking status: Never   Smokeless tobacco: Never  Vaping Use   Vaping Use: Never used  Substance and Sexual Activity   Alcohol use: Yes    Alcohol/week: 1.0 - 2.0 standard drink    Types: 1 Glasses of wine per week   Drug use: No   Sexual activity: Yes    Partners: Male    Birth control/protection: Pill, Other-see comments    Comment: Microgestin 20  Other Topics Concern   Not on file  Social History Narrative   Not on file   Social Determinants of Health   Financial Resource Strain: Not on file  Food Insecurity: Not  on file  Transportation Needs: Not on file  Physical Activity: Not on file  Stress: Not on file  Social Connections: Not on file     FAMILY HISTORY:  We obtained a detailed, 4-generation family history.  Significant diagnoses are listed below: Family History  Problem Relation Age of Onset   Breast cancer Mother 65       bilateral mastectomy   Cancer Mother    Vision loss Mother    Diabetes Father    Skin cancer Sister        basal cell carcinoma; head/face   Skin cancer Maternal Grandfather        nose; dx after 52   Breast cancer Other        maternal great aunt x2; dx before 10    Renee [name] is unaware of previous  family history of genetic testing for hereditary cancer risks besides that mentioned above.There is no reported Ashkenazi Jewish ancestry. There is no known consanguinity.  GENETIC COUNSELING ASSESSMENT: Renee [name] is a 50 y.o. female with a personal and family history of cancer which is somewhat suggestive of a hereditary cancer syndrome and predisposition to cancer given her age of diagnosis and the presence of related cancers in the family. We, therefore, discussed and recommended the following at today's visit.   DISCUSSION: We discussed that 5 - 10% of cancer is hereditary, with most cases of hereditary breast cancer associated with mutations in BRCA1/2.  There are other genes that can be associated with hereditary breast and thyroid cancer syndromes.  Type of cancer risk and level of risk are gene-specific. We discussed that testing is beneficial for several reasons including knowing how to follow individuals after completing their treatment, identifying whether potential treatment options would be beneficial, and understanding if other family members could be at risk for cancer and allowing them to undergo genetic testing.   We reviewed the characteristics, features and inheritance patterns of hereditary cancer syndromes. We also discussed genetic testing, including the appropriate family members to test, the process of testing, insurance coverage and turn-around-time for results. We discussed the implications of a negative, positive and/or variant of uncertain significant result. In order to get genetic test results in a timely manner so that Renee [name] can use these genetic test results for surgical decisions, we recommended Renee [name] pursue genetic testing for the [facility] BRCAPlus Panel.  The BRCAplus panel offered by [employer] and includes sequencing and deletion/duplication analysis for the following 8 genes: ATM, BRCA1, BRCA2, CDH1, CHEK2, PALB2, PTEN, and TP53. Once complete, we  recommend Renee [name] pursue reflex genetic testing to a more comprhensive gene panel which includes genes associated with thyroid, skin cancer, and other cancers.   The CancerNext-Expanded gene panel offered by [facility] and includes sequencing, rearrangement, and RNA analysis for the following 77 genes: AIP, ALK, APC, ATM, AXIN2, BAP1, BARD1, BLM, BMPR1A, BRCA1, BRCA2, BRIP1, CDC73, CDH1, CDK4, CDKN1B, CDKN2A, CHEK2, CTNNA1, DICER1, FANCC, FH, FLCN, GALNT12, KIF1B, LZTR1, MAX, MEN1, MET, MLH1, MSH2, MSH3, MSH6, MUTYH, NBN, NF1, NF2, NTHL1, PALB2, PHOX2B, PMS2, POT1, PRKAR1A, PTCH1, PTEN, RAD51C, RAD51D, RB1, RECQL, RET, SDHA, SDHAF2, SDHB, SDHC, SDHD, SMAD4, SMARCA4, SMARCB1, SMARCE1, STK11, SUFU, TMEM127, TP53, TSC1, TSC2, VHL and XRCC2 (sequencing and deletion/duplication); EGFR, EGLN1, HOXB13, KIT, MITF, PDGFRA, POLD1, and POLE (sequencing only); EPCAM and GREM1 (deletion/duplication only).   Based on Ms. Coba's personal and family history of cancer, she meets medical criteria for genetic testing. Despite that she meets criteria, she may  still have an out of pocket cost. We discussed that if her out of pocket cost for testing is over $100, the laboratory will contact her to discuss self-pay options and/or patient pay assistance programs.   PLAN: After considering the risks, benefits, and limitations, Ms. Hieronymus provided informed consent to pursue genetic testing and the blood sample was sent to [facility] for analysis of the BRCAPlus and CancerNext-Expanded +RNAinisght. Results should be available within approximately 1-2 weeks' time, at which point they will be disclosed by telephone to Ms. Ley, as will any additional recommendations warranted by these results. Ms. Stepney will receive a summary of her genetic counseling visit and a copy of her results once available. This information will also be available in Epic.   Lastly, we encouraged Ms. Fukuda to remain in  contact with cancer genetics annually so that we can continuously update the family history and inform her of any changes in cancer genetics and testing that may be of benefit for this family.   Ms. Burlison questions were answered to her satisfaction today. Our contact information was provided should additional questions or concerns arise. Thank you for the referral and allowing [place] to share in the care of your patient.   Hilton Saephan M. Joette Catching, [place], [facility] Genetic Counselor [email or web address] ([phone]  The patient was seen for a total of 20 minutes in face-to-face genetic counseling.  The patient was accompanied by her husband, [name]. Drs. Magrinat, Lindi Adie and/or Burr Medico were available to discuss this case as needed.    _______________________________________________________________________ For Office Staff:  Number of people involved in session: 2 Was an Intern/ student involved with case: no

## 2021-01-14 NOTE — H&P (Signed)
REFERRING PHYSICIAN:  Dr. Jenny Reichmann Russo/ Dr. Edwinna Areola  PROVIDER:  Carlean Jews, MD  MRN: [id number] DOB: 05-13-1971 DATE OF ENCOUNTER: 01/14/2021  Subjective   Chief Complaint: Breast Cancer (Invasive ductal carcinoma left breast)   Breast MDC 01/14/21 Magrinat, Isidore Moos  History of Present Illness: Renee [name] is a 50 y.o. female who is seen today as an office consultation at the request of Dr. Shon Baton for evaluation of Breast Cancer (Invasive ductal carcinoma left breast) .    This is a 50 year old female who presents after routine screening mammogram revealed a possible mass in the left breast.  Ultrasound was recommended.  On 12/25/2020 she underwent left breast ultrasound.  This detected a 3.7 x 3.1 x 3.1 cm mass located at 1:00, 2 cm from the nipple.  The axilla was unremarkable.  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  The patient has a family history of breast cancer in her mother.  The patient previously had thyroid cancer treated with thyroidectomy in [place] in 2013.   Review of Systems: A complete review of systems was obtained from the patient.  I have reviewed this information and discussed as appropriate with the patient.  See HPI as well for other ROS.  Review of Systems  Constitutional: Negative.   HENT: Positive for sinus pain.   Eyes: Negative.   Respiratory: Negative.   Cardiovascular: Negative.   Gastrointestinal: Negative.   Genitourinary: Negative.   Musculoskeletal: Negative.   Skin: Negative.   Neurological: Negative.   Endo/Heme/Allergies: Negative.   Psychiatric/Behavioral: The patient is nervous/anxious.       Medical History: Past Medical History:  Diagnosis Date   History of cancer    Thyroid disease     Patient Active Problem List  Diagnosis   Abnormal mammogram   Attention deficit disorder (ADD) in adult    Astigmatism with presbyopia, bilateral   Attention deficit hyperactivity disorder, predominantly inattentive type   Backache   Bunion of unspecified foot   Dysmenorrhea   Encounter for general adult medical examination without abnormal findings   History of malignant neoplasm of thyroid   Hypothyroidism   Joint pain   Malignant neoplasm of upper-outer quadrant of left breast in female, estrogen receptor positive (CMS-HCC)   Osteoarthritis of knee   Overactive bladder   Overweight   Pain in right foot   Post-surgical hypothyroidism   Seasonal allergic rhinitis   Thyroid cancer (CMS-HCC)   Unspecified multiple injuries, initial encounter    Past Surgical History:  Procedure Laterality Date   hysterscopy N/A    THYROIDECTOMY TOTAL       Allergies  Allergen Reactions   Erythromycin Other (See Comments)    Unknown Reaction Occurred in Early Childhood Childhood Childhood     Current Outpatient Medications on File Prior to Visit  Medication Sig Dispense Refill   dextroamphetamine-amphetamine (ADDERALL XR) 10 MG XR capsule Take 1 tablet po q daily-patient wants generic Rx DO NOT FILL UNTIL DUE     FLUoxetine (PROZAC) 20 MG tablet Take 20 mg by mouth once daily     norethindrone-ethinyl estradiol (JUNEL 1/20) 1-20 mg-mcg tablet TAKE 1 TABLET BY MOUTH ONCE DAILY, TAKE CONTINUOUSLY     No current facility-administered medications on file prior to visit.    Family History  Problem Relation Age of Onset   Breast cancer Mother      Social History   Tobacco Use  Smoking Status Never Smoker  Smokeless Tobacco Never Used     Social History   Socioeconomic History   Marital status: Married  Tobacco Use   Smoking status: Never Smoker   Smokeless tobacco: Never Used  Substance and Sexual Activity   Alcohol use: Defer   Drug use: Defer    Objective:     Physical Exam   Constitutional:  WDWN in NAD, conversant, no obvious deformities; lying in bed  comfortably Eyes:  Pupils equal, round; sclera anicteric; moist conjunctiva; no lid lag HENT:  Oral mucosa moist; good dentition  Neck:  No masses palpated, trachea midline; no thyromegaly Lungs:  CTA bilaterally; normal respiratory effort Breasts:  symmetric, no nipple changes; no palpable masses or lymphadenopathy on either side CV:  Regular rate and rhythm; no murmurs; extremities well-perfused with no edema Abd:  +bowel sounds, soft, non-tender, no palpable organomegaly; no palpable hernias Musc:  Unable to assess gait; no apparent clubbing or cyanosis in extremities Lymphatic:  No palpable cervical or axillary lymphadenopathy Skin:  Warm, dry; no sign of jaundice Psychiatric - alert and oriented x 4; calm mood and affect   Labs, Imaging and Diagnostic Testing: Path - Grade 2 IDC with extracellular mucin, ER/PR +, Her 2 -, Ki67 20%  CLINICAL DATA:  50 year old female presenting for screening recall for a left breast mass. The patient has family history of breast cancer in her mother and her maternal aunt.   EXAM: ULTRASOUND OF THE LEFT BREAST   COMPARISON:  Previous exam(s).   FINDINGS: Ultrasound targeted to the left breast at 1 o'clock, 2 cm from the nipple demonstrates an oval heterogeneous mass with indistinct margins and significant vascularity. The mass measures 3.7 x 3.1 x 3.1 cm. Multiple normal-appearing lymph nodes are identified in the left axilla.   IMPRESSION: 1.  Suspicious 3.7 cm left breast mass at 1 o'clock.   2.  No evidence of left axillary lymphadenopathy.   RECOMMENDATION: 1. Ultrasound-guided biopsy is recommended for the left breast mass. This has been scheduled for 01/05/2021 at 7:30 a.m.   2. If the pathology results demonstrate malignancy, MRI is suggested given the patient's breast tissue density, age and family history.   I have discussed the findings and recommendations with the patient. If applicable, a reminder letter will be sent to  the patient regarding the next appointment.   BI-RADS CATEGORY  5: Highly suggestive of malignancy.     Electronically Signed   By: Ammie Ferrier M.D.   On: 12/25/2020 08:25   Assessment and Plan:  Diagnoses and all orders for this visit:  Malignant neoplasm of upper-outer quadrant of left breast in female, estrogen receptor positive (CMS-HCC)   IDC-2, ER/PR +, Her 2 -, Ki6720%  We discussed her disease along with the recommended treatment plan.  MammaPrint is being sent on her biopsy specimen to help determine whether she would benefit from chemotherapy.  She might be a good candidate for neoadjuvant chemotherapy to help shrink the tumor prior to lumpectomy along with sentinel lymph node biopsy.  Genetic testing is also being sent.  However, the patient is leaning towards bilateral mastectomies (with left sentinel lymph node biopsy) with immediate reconstruction.  She is concerned due to her mother's recent history of breast cancer as well as her lifetime risk of recurrent breast cancer.  She is not yet ready to make a decision regarding surgery.  Therefore we will refer her to plastic surgery for discussion.  There are concerns with the large  size of her mass that a generous lumpectomy (around 5 cm) might leave a less than ideal cosmetic outcome.  She will discuss possible oncoplastic techniques as well as a possible reduction on the contralateral side as an option to bilateral mastectomies with reconstruction.  On the left side, she is not a good candidate for nipple sparing mastectomy due to the proximity of the large cancer to her nipple and the overlying skin.  We have referred the patient to plastic surgery.  MammaPrint is pending.  Genetics are pending.  We will await the results of these test as well as the patient's decision regarding surgery.  No follow-ups on file.  Carlean Jews, MD  01/14/2021 12:09 PM   I had direct face-to-face contact with the patient for a total  of 65 minutes minutes and greater than 50% of that time was spent providing counseling and/or coordination of care for the patient regarding left invasive ductal carcinoma .  Imogene Burn. Georgette Dover, MD, [facility] Surgery  General Surgery   01/14/2021 12:14 PM

## 2021-01-14 NOTE — H&P (View-Only) (Signed)
REFERRING PHYSICIAN:  Dr. Jenny Reichmann Russo/ Dr. Edwinna Areola  PROVIDER:  Carlean Jews, MD  MRN: [id number] DOB: 05-13-1971 DATE OF ENCOUNTER: 01/14/2021  Subjective   Chief Complaint: Breast Cancer (Invasive ductal carcinoma left breast)   Breast [facility]DC 01/14/21 Magrinat, Isidore Moos  History of Present Illness: Renee [name] is a 50 y.o. female who is seen today as an office consultation at the request of Dr. Shon Baton for evaluation of Breast Cancer (Invasive ductal carcinoma left breast) .    This is a 50 year old female who presents after routine screening mammogram revealed a possible mass in the left breast.  Ultrasound was recommended.  On 12/25/2020 she underwent left breast ultrasound.  This detected a 3.7 x 3.1 x 3.1 cm mass located at 1:00, 2 cm from the nipple.  The axilla was unremarkable.  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  The patient has a family history of breast cancer in her mother.  The patient previously had thyroid cancer treated with thyroidectomy in [place] in 2013.   Review of Systems: A complete review of systems was obtained from the patient.  I have reviewed this information and discussed as appropriate with the patient.  See HPI as well for other ROS.  Review of Systems  Constitutional: Negative.   HENT: Positive for sinus pain.   Eyes: Negative.   Respiratory: Negative.   Cardiovascular: Negative.   Gastrointestinal: Negative.   Genitourinary: Negative.   Musculoskeletal: Negative.   Skin: Negative.   Neurological: Negative.   Endo/Heme/Allergies: Negative.   Psychiatric/Behavioral: The patient is nervous/anxious.       Medical History: Past Medical History:  Diagnosis Date   History of cancer    Thyroid disease     Patient Active Problem List  Diagnosis   Abnormal mammogram   Attention deficit disorder (ADD) in adult    Astigmatism with presbyopia, bilateral   Attention deficit hyperactivity disorder, predominantly inattentive type   Backache   Bunion of unspecified foot   Dysmenorrhea   Encounter for general adult medical examination without abnormal findings   History of malignant neoplasm of thyroid   Hypothyroidism   Joint pain   Malignant neoplasm of upper-outer quadrant of left breast in female, estrogen receptor positive (CMS-HCC)   Osteoarthritis of knee   Overactive bladder   Overweight   Pain in right foot   Post-surgical hypothyroidism   Seasonal allergic rhinitis   Thyroid cancer (CMS-HCC)   Unspecified multiple injuries, initial encounter    Past Surgical History:  Procedure Laterality Date   hysterscopy N/A    THYROIDECTOMY TOTAL       Allergies  Allergen Reactions   Erythromycin Other (See Comments)    Unknown Reaction Occurred in Early Childhood Childhood Childhood     Current Outpatient Medications on File Prior to Visit  Medication Sig Dispense Refill   dextroamphetamine-amphetamine (ADDERALL XR) 10 MG XR capsule Take 1 tablet po q daily-patient wants generic Rx DO NOT FILL UNTIL DUE     FLUoxetine (PROZAC) 20 MG tablet Take 20 mg by mouth once daily     norethindrone-ethinyl estradiol (JUNEL 1/20) 1-20 mg-mcg tablet TAKE 1 TABLET BY MOUTH ONCE DAILY, TAKE CONTINUOUSLY     No current facility-administered medications on file prior to visit.    Family History  Problem Relation Age of Onset   Breast cancer Mother      Social History   Tobacco Use  Smoking Status Never Smoker  Smokeless Tobacco Never Used     Social History   Socioeconomic History   Marital status: Married  Tobacco Use   Smoking status: Never Smoker   Smokeless tobacco: Never Used  Substance and Sexual Activity   Alcohol use: Defer   Drug use: Defer    Objective:     Physical Exam   Constitutional:  WDWN in NAD, conversant, no obvious deformities; lying in bed  comfortably Eyes:  Pupils equal, round; sclera anicteric; moist conjunctiva; no lid lag HENT:  Oral mucosa moist; good dentition  Neck:  No masses palpated, trachea midline; no thyromegaly Lungs:  CTA bilaterally; normal respiratory effort Breasts:  symmetric, no nipple changes; no palpable masses or lymphadenopathy on either side CV:  Regular rate and rhythm; no murmurs; extremities well-perfused with no edema Abd:  +bowel sounds, soft, non-tender, no palpable organomegaly; no palpable hernias Musc:  Unable to assess gait; no apparent clubbing or cyanosis in extremities Lymphatic:  No palpable cervical or axillary lymphadenopathy Skin:  Warm, dry; no sign of jaundice Psychiatric - alert and oriented x 4; calm mood and affect   Labs, Imaging and Diagnostic Testing: Path - Grade 2 IDC with extracellular mucin, ER/PR +, Her 2 -, Ki67 20%  CLINICAL DATA:  50 year old female presenting for screening recall for a left breast mass. The patient has family history of breast cancer in her mother and her maternal aunt.   EXAM: ULTRASOUND OF THE LEFT BREAST   COMPARISON:  Previous exam(s).   FINDINGS: Ultrasound targeted to the left breast at 1 o'clock, 2 cm from the nipple demonstrates an oval heterogeneous mass with indistinct margins and significant vascularity. The mass measures 3.7 x 3.1 x 3.1 cm. Multiple normal-appearing lymph nodes are identified in the left axilla.   IMPRESSION: 1.  Suspicious 3.7 cm left breast mass at 1 o'clock.   2.  No evidence of left axillary lymphadenopathy.   RECOMMENDATION: 1. Ultrasound-guided biopsy is recommended for the left breast mass. This has been scheduled for 01/05/2021 at 7:30 a.m.   2. If the pathology results demonstrate malignancy, MRI is suggested given the patient's breast tissue density, age and family history.   I have discussed the findings and recommendations with the patient. If applicable, a reminder letter will be sent to  the patient regarding the next appointment.   BI-RADS CATEGORY  5: Highly suggestive of malignancy.     Electronically Signed   By: Ammie Ferrier M.D.   On: 12/25/2020 08:25   Assessment and Plan:  Diagnoses and all orders for this visit:  Malignant neoplasm of upper-outer quadrant of left breast in female, estrogen receptor positive (CMS-HCC)   IDC-2, ER/PR +, Her 2 -, Ki6720%  We discussed her disease along with the recommended treatment plan.  MammaPrint is being sent on her biopsy specimen to help determine whether she would benefit from chemotherapy.  She might be a good candidate for neoadjuvant chemotherapy to help shrink the tumor prior to lumpectomy along with sentinel lymph node biopsy.  Genetic testing is also being sent.  However, the patient is leaning towards bilateral mastectomies (with left sentinel lymph node biopsy) with immediate reconstruction.  She is concerned due to her mother's recent history of breast cancer as well as her lifetime risk of recurrent breast cancer.  She is not yet ready to make a decision regarding surgery.  Therefore we will refer her to plastic surgery for discussion.  There are concerns with the large  size of her mass that a generous lumpectomy (around 5 cm) might leave a less than ideal cosmetic outcome.  She will discuss possible oncoplastic techniques as well as a possible reduction on the contralateral side as an option to bilateral mastectomies with reconstruction.  On the left side, she is not a good candidate for nipple sparing mastectomy due to the proximity of the large cancer to her nipple and the overlying skin.  We have referred the patient to plastic surgery.  MammaPrint is pending.  Genetics are pending.  We will await the results of these test as well as the patient's decision regarding surgery.  No follow-ups on file.  Carlean Jews, MD  01/14/2021 12:09 PM   I had direct face-to-face contact with the patient for a total  of 65 minutes minutes and greater than 50% of that time was spent providing counseling and/or coordination of care for the patient regarding left invasive ductal carcinoma .  Imogene Burn. Georgette Dover, MD, [facility] Surgery  General Surgery   01/14/2021 12:14 PM

## 2021-01-14 NOTE — Progress Notes (Signed)
[place] Work  Initial Assessment   Renee [name] is a 50 y.o. year old female accompanied by husband, [name]. Clinical Social Work was referred by [facility] for assessment of psychosocial needs.   SDOH (Social Determinants of Health) assessments performed: Yes SDOH Interventions    Flowsheet Row Most Recent Value  SDOH Interventions   Food Insecurity Interventions Intervention Not Indicated  Housing Interventions Intervention Not Indicated  Transportation Interventions Intervention Not Indicated       Distress Screen completed: Yes ONCBCN DISTRESS SCREENING 01/14/2021  Screening Type Initial Screening  Distress experienced in past week (1-10) 4  Emotional problem type Nervousness/Anxiety  Information Concerns Type Lack of info about complementary therapy choices    Family/Social Information:  Housing Arrangement: patient lives with husband, 64yo child Family members/support persons in your life? Family Transportation concerns: no  Employment: works as Dispensing optician. Will take time off during treatment. Income source: Employment and husband's employment Financial concerns: No Type of concern: None Food access concerns: no Services Currently in place:  n/a  Coping/ Adjustment to diagnosis: Patient understands treatment plan and what happens next? yes, still exploring some options but understands the general plan and choices Concerns about diagnosis and/or treatment:  General adjustment to diagnosis and processing information Patient reported stressors:  nervousness/anxiety Current coping skills/ strengths: Capable of independent living, Communication skills, Motivation for treatment/growth, Special hobby/interest, and Supportive family/friends    SUMMARY: Current SDOH Barriers:  No significant SDOH barriers noted today  Clinical Social Work Clinical Goal(s):  No clinical SW goals at this time  Interventions: Discussed common feeling and  emotions when being diagnosed with cancer, and the importance of support during treatment Informed patient of the support team roles and support services at [facility] Provided CSW contact information and encouraged patient to call with any questions or concerns   Follow Up Plan: Patient will contact CSW with any support or resource needs Patient verbalizes understanding of plan: Yes    Christeen Douglas LCSW

## 2021-01-14 NOTE — Telephone Encounter (Signed)
Ordered mammaprint (core) per Dr. Jana Hakim. Faxed requisition to [facility] and pathology.

## 2021-01-16 ENCOUNTER — Telehealth: Payer: Self-pay | Admitting: Oncology

## 2021-01-16 NOTE — Telephone Encounter (Signed)
Scheduled per 8/10 los. Called and spoke with pt confirmed 9/8 appt

## 2021-01-19 ENCOUNTER — Encounter: Payer: Self-pay | Admitting: *Deleted

## 2021-01-19 ENCOUNTER — Telehealth: Payer: Self-pay | Admitting: *Deleted

## 2021-01-19 DIAGNOSIS — C50412 Malignant neoplasm of upper-outer quadrant of left female breast: Secondary | ICD-10-CM | POA: Diagnosis not present

## 2021-01-19 NOTE — Telephone Encounter (Signed)
Left vm regarding [facility] from 8.10.22. Contact information provided for questions or needs.

## 2021-01-20 ENCOUNTER — Telehealth: Payer: Self-pay | Admitting: Genetic Counselor

## 2021-01-20 ENCOUNTER — Encounter: Payer: Self-pay | Admitting: Genetic Counselor

## 2021-01-20 DIAGNOSIS — Z1379 Encounter for other screening for genetic and chromosomal anomalies: Secondary | ICD-10-CM | POA: Insufficient documentation

## 2021-01-20 DIAGNOSIS — Z170 Estrogen receptor positive status [ER+]: Secondary | ICD-10-CM | POA: Diagnosis not present

## 2021-01-20 DIAGNOSIS — C50512 Malignant neoplasm of lower-outer quadrant of left female breast: Secondary | ICD-10-CM | POA: Diagnosis not present

## 2021-01-20 NOTE — Telephone Encounter (Signed)
Contacted patient in attempt to disclose results of genetic testing.  LVM with contact information requesting a call back.  

## 2021-01-22 ENCOUNTER — Ambulatory Visit: Payer: PRIVATE HEALTH INSURANCE

## 2021-01-22 NOTE — Telephone Encounter (Signed)
Revealed negative genetic testing.  Discussed that we do not know why she has breast cancer or why there is cancer in the family. It could be familial, due to a different gene that we are not testing, or maybe our current technology may not be able to pick something up.  It will be important for her to keep in contact with genetics to keep up with whether additional testing may be needed.

## 2021-01-23 ENCOUNTER — Telehealth: Payer: Self-pay | Admitting: *Deleted

## 2021-01-23 ENCOUNTER — Encounter: Payer: Self-pay | Admitting: *Deleted

## 2021-01-23 NOTE — Telephone Encounter (Signed)
Received mammaprint results of high risk.  Left voicemail for a return phone call.

## 2021-01-26 ENCOUNTER — Ambulatory Visit: Payer: Self-pay | Admitting: Surgery

## 2021-01-26 ENCOUNTER — Telehealth: Payer: Self-pay | Admitting: *Deleted

## 2021-01-26 ENCOUNTER — Encounter: Payer: Self-pay | Admitting: *Deleted

## 2021-01-26 DIAGNOSIS — C50912 Malignant neoplasm of unspecified site of left female breast: Secondary | ICD-10-CM

## 2021-01-26 NOTE — Telephone Encounter (Signed)
Spoke with patient regarding high risk mammaprint. She is scheduled to see Dr. Jana Hakim 8/24 to discuss.

## 2021-01-28 ENCOUNTER — Inpatient Hospital Stay (HOSPITAL_BASED_OUTPATIENT_CLINIC_OR_DEPARTMENT_OTHER): Payer: BC Managed Care – PPO | Admitting: Oncology

## 2021-01-28 ENCOUNTER — Encounter: Payer: Self-pay | Admitting: *Deleted

## 2021-01-28 ENCOUNTER — Other Ambulatory Visit: Payer: Self-pay

## 2021-01-28 VITALS — BP 135/56 | HR 90 | Temp 98.4°F | Resp 18 | Ht 69.75 in | Wt 201.1 lb

## 2021-01-28 DIAGNOSIS — C50412 Malignant neoplasm of upper-outer quadrant of left female breast: Secondary | ICD-10-CM

## 2021-01-28 DIAGNOSIS — Z79899 Other long term (current) drug therapy: Secondary | ICD-10-CM | POA: Diagnosis not present

## 2021-01-28 DIAGNOSIS — Z170 Estrogen receptor positive status [ER+]: Secondary | ICD-10-CM

## 2021-01-28 DIAGNOSIS — Z85850 Personal history of malignant neoplasm of thyroid: Secondary | ICD-10-CM | POA: Diagnosis not present

## 2021-01-28 DIAGNOSIS — Z9013 Acquired absence of bilateral breasts and nipples: Secondary | ICD-10-CM | POA: Diagnosis not present

## 2021-01-28 NOTE — Progress Notes (Signed)
[place]  [phone] [phone]     ID: Renee [name] DOB: [birth year]  MR#: [id number]  LGX#:[id number]  Patient Care Team: Shon Baton, MD as PCP - General (Internal Medicine) Rockwell Germany, RN as Oncology Nurse Navigator Mauro Kaufmann, RN as Oncology Nurse Navigator Donnie Mesa, MD as Consulting Physician (General Surgery) Cailen Mihalik, Virgie Dad, MD as Consulting Physician (Oncology) Eppie Gibson, MD as Attending Physician (Radiation Oncology) Hollar, Katharine Look, MD as Referring Physician (Dermatology) Irene Limbo, MD as Consulting Physician (Plastic Surgery) Chauncey Cruel, MD OTHER MD:  CHIEF COMPLAINT: Estrogen receptor positive breast cancer  CURRENT TREATMENT: Awaiting definitive surgery   INTERVAL HISTORY: Renee [name] returns today for further discussion of management of her estrogen receptor positive breast cancer.  She is accompanied by her friend Renee [name].    Since her last visit here we obtained her genetics results, which did not show a deleterious mutation.  We also obtained the results of the MammaPrint which unfortunately shows her tumor is high risk.  Renee [name] has also met with general and plastic surgery to review her surgical options including bilateral mastectomies.  She is working with an Chartered certified accountant from [place] and has started on alkaline diet, drinking herbal tea, and having some supplements including vitamin C selenium vitamin D zinc and B 100.  She is eating a raw diet.  She is hoping this will cleanse her system before the surgery.  She says she tried something similar to this before her thyroid surgery and her thyroid tumor had shrunk by the time it was removed.  She is not doing this as a test however to see if the cancer shrinks before surgery.  She just wants to "clean out her system".  REVIEW OF SYSTEMS: A detailed review of systems today was otherwise stable   COVID 19 VACCINATION STATUS:  refuses vaccination, infection 07/2020   HISTORY OF CURRENT ILLNESS: Renee [name] ("sir'noi-YEH-vitch") had routine screening mammography on 12/05/2020 showing a possible abnormality in the left breast. She underwent left breast ultrasonography at [facility] on 12/25/2020 showing: suspicious 3.7 cm mass in left breast at 1 o'clock; no evidence of left axillary lymphadenopathy.  Accordingly on 01/05/2021 she proceeded to biopsy of the left breast area in question. The pathology from this procedure ([id number]) showed: invasive ductal carcinoma, grade 2, with extracellular mucin. Prognostic indicators significant for: estrogen receptor, 95% positive and progesterone receptor, 90% positive, both with strong staining intensity. Proliferation marker Ki67 at 20%. HER2 equivocal by immunohistochemistry (2+), but negative by fluorescent in situ hybridization with a signals ratio 1.26 and number per cell 2.15.  Cancer Staging Malignant neoplasm of upper-outer quadrant of left breast in female, estrogen receptor positive ([name]) Staging form: Breast, AJCC 8th Edition - Clinical: Stage IB (cT2, cN0, cM0, G2, ER+, PR+, HER2-) - Signed by Chauncey Cruel, MD on 01/14/2021 Histologic grading system: 3 grade system  The patient has a history of thyroid cancer status post total thyroidectomy and lymph node sampling (with positive lymph nodes) 2013 in [place].  She will bring the pathology report for further review at a different time  The patient's subsequent history is as detailed below.  PAST MEDICAL HISTORY: Past Medical History:  Diagnosis Date   Anxiety    Breast cancer ([place])    Cancer ([place]) 2013 Thyroid   Depression    Endometrial polyp 01/14/2016   Family history of breast cancer 01/14/2021   Fibroid, uterine    History of thyroid cancer 01/14/2021  Infertility, female    Thyroid cancer ([place]) 2013   Urinary incontinence   Her thyroid cancer was diagnosed in 2013 in [place], [place]. (She  has a copy of her pathology report)   PAST SURGICAL HISTORY: Past Surgical History:  Procedure Laterality Date   HYSTEROSCOPY WITH D & C  10/13/2017   polyp resection   TOTAL THYROIDECTOMY  2013    FAMILY HISTORY: Family History  Problem Relation Age of Onset   Breast cancer Mother 58       bilateral mastectomy   Cancer Mother    Vision loss Mother    Diabetes Father    Skin cancer Sister        basal cell carcinoma; head/face   Skin cancer Maternal Grandfather        nose; dx after 36   Breast cancer Other        maternal great aunt x2; dx before 17   Her father is 45 and mother age 22, as of 01/2021. [name] has one sister (and no brothers). Her mother, Renee [name]. Renee [name], was my patient for breast cancer.  The patient also reports breast cancer in a maternal great-aunt (mother [name]'s aunt).   GYNECOLOGIC HISTORY:  No LMP recorded. (Menstrual status: Oral contraceptives). Menarche: 50 years old Age at first live birth: 50 years old [place] P 1 LMP 4+ years ago due to continuous birth control use Contraceptive: current use HRT n/a  Hysterectomy? no BSO? 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 Husband Renee [name] is vice Radio producer in [employer].. She lives at home with husband Renee [name] and their son Renee [name], age 66. She attends [employer]    ADVANCED DIRECTIVES: In the absence of any documentation to the contrary, the patient's spouse is their HCPOA.    HEALTH MAINTENANCE: Social History   Tobacco Use   Smoking status: Never   Smokeless tobacco: Never  Vaping Use   Vaping Use: Never used  Substance Use Topics   Alcohol use: Yes    Alcohol/week: 1.0 - 2.0 standard drink    Types: 1 Glasses of wine per week   Drug use: No     Colonoscopy: never done  PAP: 10/2019, negative  Bone density: n/a (age)   Allergies  Allergen Reactions   Erythromycin Other (See  Comments)    Childhood- stomach ache    Current Outpatient Medications  Medication Sig Dispense Refill   ADDERALL XR 10 MG 24 hr capsule Take 1 capsule by mouth daily.  0   FLUoxetine (PROZAC) 20 MG tablet Take 1 tablet (20 mg total) by mouth daily. 90 tablet 1   levothyroxine (SYNTHROID, LEVOTHROID) 125 MCG tablet Take 1 tablet by mouth daily. For 2 days a week  3   levothyroxine (SYNTHROID, LEVOTHROID) 137 MCG tablet Take 1 tablet by mouth daily. For 5 days a week  5   MICROGESTIN 1-20 MG-MCG tablet TAKE 1 TABLET BY MOUTH ONCE DAILY, TAKE CONTINUOUSLY 84 tablet 1   Multiple Vitamin (MULTIVITAMIN PO) Take by mouth.     No current facility-administered medications for this visit.    OBJECTIVE: White woman in no acute distress  Vitals:   01/28/21 1636  BP: (!) 135/56  Pulse: 90  Resp: 18  Temp: 98.4 F (36.9 C)  SpO2: 99%      Body mass index is 29.06 kg/m.   Wt Readings from Last 3 Encounters:  01/28/21 201 lb 1.6 oz (91.2 kg)  01/14/21 206 lb (93.4 kg)  10/19/19 188 lb (85.3 kg)      ECOG FS:1 - Symptomatic but completely ambulatory  Sclerae unicteric, EOMs intact Wearing a mask No cervical or supraclavicular adenopathy Lungs no rales or rhonchi Heart regular rate and rhythm Abd soft, nontender, positive bowel sounds MSK no focal spinal tenderness, no upper extremity lymphedema Neuro: nonfocal, well oriented, appropriate affect Breasts: Breasts: The right breast is benign.  The left breast mass is again easily palpable, movable, and does not involve the skin.  Both axillae are benign   LAB RESULTS:  CMP     Component Value Date/Time   NA 136 01/14/2021 0816   K 4.2 01/14/2021 0816   CL 104 01/14/2021 0816   CO2 23 01/14/2021 0816   GLUCOSE 95 01/14/2021 0816   BUN 12 01/14/2021 0816   CREATININE 0.78 01/14/2021 0816   CALCIUM 9.5 01/14/2021 0816   PROT 7.0 01/14/2021 0816   ALBUMIN 3.6 01/14/2021 0816   AST 16 01/14/2021 0816   ALT 21 01/14/2021 0816    ALKPHOS 72 01/14/2021 0816   BILITOT 0.4 01/14/2021 0816   GFRNONAA >60 01/14/2021 0816    No results found for: TOTALPROTELP, ALBUMINELP, A1GS, A2GS, BETS, BETA2SER, GAMS, MSPIKE, SPEI  Lab Results  Component Value Date   WBC 8.3 01/14/2021   NEUTROABS 5.7 01/14/2021   HGB 14.3 01/14/2021   HCT 41.7 01/14/2021   MCV 90.5 01/14/2021   PLT 250 01/14/2021    No results found for: LABCA2  No components found for: POEUMP536  No results for input(s): INR in the last 168 hours.  No results found for: LABCA2  No results found for: RWE315  No results found for: QMG867  No results found for: YPP509  No results found for: CA2729  No components found for: HGQUANT  No results found for: CEA1 / No results found for: CEA1   No results found for: AFPTUMOR  No results found for: CHROMOGRNA  No results found for: KPAFRELGTCHN, LAMBDASER, KAPLAMBRATIO (kappa/lambda light chains)  No results found for: HGBA, HGBA2QUANT, HGBFQUANT, HGBSQUAN (Hemoglobinopathy evaluation)   No results found for: LDH  No results found for: IRON, TIBC, IRONPCTSAT (Iron and TIBC)  No results found for: FERRITIN  Urinalysis No results found for: COLORURINE, APPEARANCEUR, LABSPEC, PHURINE, GLUCOSEU, HGBUR, BILIRUBINUR, KETONESUR, PROTEINUR, UROBILINOGEN, NITRITE, LEUKOCYTESUR   STUDIES: MM CLIP PLACEMENT LEFT  Result Date: 01/05/2021 CLINICAL DATA:  Status post ultrasound-guided core biopsy of a left breast mass. EXAM: 3D DIAGNOSTIC LEFT MAMMOGRAM POST ULTRASOUND BIOPSY COMPARISON:  Previous exam(s). FINDINGS: 3D Mammographic images were obtained following ultrasound guided biopsy of a mass in the left breast. The biopsy marking clip is in the upper-outer quadrant of the left breast. IMPRESSION: Appropriate positioning of the ribbon shaped biopsy marking clip at the site of biopsy in the upper-outer quadrant of the left breast. Final Assessment: Post Procedure Mammograms for Marker Placement  Electronically Signed   By: Lillia Mountain M.D.   On: 01/05/2021 08:37  [place] LT BREAST BX W LOC DEV 1ST LESION IMG BX SPEC US GUIDE  Addendum Date: 01/06/2021   ADDENDUM REPORT: 01/06/2021 11:16 ADDENDUM: Pathology revealed GRADE II INVASIVE DUCTAL CARCINOMA WITH EXTRACELLULAR MUCIN of the Left breast, upper outer quadrant, (ribbon clip). This was found to be concordant by Dr. Lillia Mountain. Pathology results were discussed with the patient by telephone. The patient reported doing well after the biopsy with tenderness at the site. Post  biopsy instructions and care were reviewed and questions were answered. The patient was encouraged to call [facility] for any additional concerns. My direct phone number was provided. The patient was referred to [facility] on January 14, 2021. Recommendation for a bilateral breast MRI given the patient's breast tissue density, age and family history. Pathology results reported by Renee Purser, RN on 01/06/2021. Electronically Signed   By: Lillia Mountain M.D.   On: 01/06/2021 11:16   Result Date: 01/06/2021 CLINICAL DATA:  Left breast mass. EXAM: ULTRASOUND GUIDED LEFT BREAST CORE NEEDLE BIOPSY COMPARISON:  Previous exam(s). PROCEDURE: I met with the patient and we discussed the procedure of ultrasound-guided biopsy, including benefits and alternatives. We discussed the high likelihood of a successful procedure. We discussed the risks of the procedure, including infection, bleeding, tissue injury, clip migration, and inadequate sampling. Informed written consent was given. The usual time-out protocol was performed immediately prior to the procedure. Lesion quadrant: Upper-outer quadrant Using sterile technique and 1% Lidocaine as local anesthetic, under direct ultrasound visualization, a 14 gauge spring-loaded device was used to perform biopsy of a mass in the 1 o'clock region of the left  breast using a inferior to superior approach. At the conclusion of the procedure ribbon shaped tissue marker clip was deployed into the biopsy cavity. Follow up 2 view mammogram was performed and dictated separately. IMPRESSION: Ultrasound guided biopsy of the left breast. No apparent complications. Electronically Signed: By: Lillia Mountain M.D. On: 01/05/2021 08:15    ELIGIBLE FOR AVAILABLE RESEARCH PROTOCOL: no  ASSESSMENT: 50 y.o. [place] woman status post left breast upper outer quadrant biopsy 01/05/2021 for a clinical T2 N0, stage IB invasive ductal carcinoma, grade 2, with mucinous features, estrogen and progesterone receptor positive, HER2 not amplified, with an MIB-1 of 20%  (1) genetics testing 01/20/2021 through the [facility] Panel found no deleterious mutations in ATM, BRCA1, BRCA2, CDH1, CHEK2, PALB2, PTEN, and TP53.  Results of pan-cancer panel are pending.   (2) MammaPrint obtained from the original biopsy shows a luminal B subtype, classified as high risk, suggesting a greater than 12% chemotherapy benefit.  The 5-year metastasis free survival with antiestrogen plus chemotherapy is predicted to be 93%  (3) definitive surgery pending  (4) adjuvant chemotherapy recommended  (5) adjuvant radiation if lumpectomy  (6) antiestrogens to follow at the completion of local treatment   PLAN: Tahirah is strongly considering bilateral mastectomies.  We reviewed the indications of bilateral mastectomies which include symmetry and anxiety reduction.  The idea that the local recurrence rate is lower with mastectomy the lumpectomy plus radiation has been challenged recently and I made her aware of that.  Also she understands that bilateral mastectomies do not reduce the risk of distant recurrence: If she already has occult microscopic disease in the liver lungs and bones removing the breast does absolutely nothing to treat that.  That of course is the rationale for systemic treatment.  The  standard treatment in her situation is for chemotherapy and antiestrogens.  I discussed the goal standard doxorubicin and cyclophosphamide followed by paclitaxel but also discussed the alternative of cyclophosphamide and docetaxel, which is only 4 doses, is over in 9 weeks (12 weeks if you cannot time to recovery from the last treatment) and does not involve any cardiac toxicities.  We discussed the use of the Dignacap in that setting and the possibility of peripheral neuropathy as well as other  side effects.  She understands that for antiestrogens I would recommend tamoxifen and I explained some of the advantages of that choice for a young woman.  However we are very far from getting to a time when she has to make a decision regarding antiestrogens.  At this point what is clear is that she does not want a port placed at the time of surgery since she has not yet made a decision regarding chemotherapy.  Tentative surgery scheduled for her is on 908 2022.  I am going to see her again about 2 weeks after that and we will discuss adjuvant systemic treatment at that time  Total encounter time 50 minutes.Sarajane Jews C. Mckinna Demars, MD 01/28/2021 6:00 PM Medical Oncology and Hematology [facility] [place], [place] [address] Tel. [phone]    Fax. [phone]   This document serves as a record of services personally performed by Lurline Del, MD. It was created on his behalf by Wilburn Mylar, a trained medical scribe. The creation of this record is based on the scribe's personal observations and the provider's statements to them.   I, Lurline Del MD, have reviewed the above documentation for accuracy and completeness, and I agree with the above.    *Total Encounter Time as defined by the Centers for Medicare and Medicaid Services includes, in addition to the face-to-face time of a patient visit (documented in the note above) non-face-to-face time: obtaining and  reviewing outside history, ordering and reviewing medications, tests or procedures, care coordination (communications with other health care professionals or caregivers) and documentation in the medical record.

## 2021-01-29 ENCOUNTER — Telehealth: Payer: Self-pay | Admitting: Oncology

## 2021-01-29 NOTE — Telephone Encounter (Signed)
R/s appt per 8/25 sch msg. Pt aware.  

## 2021-01-30 ENCOUNTER — Encounter (HOSPITAL_BASED_OUTPATIENT_CLINIC_OR_DEPARTMENT_OTHER): Payer: Self-pay | Admitting: Obstetrics & Gynecology

## 2021-01-30 ENCOUNTER — Ambulatory Visit (INDEPENDENT_AMBULATORY_CARE_PROVIDER_SITE_OTHER): Payer: BC Managed Care – PPO | Admitting: Obstetrics & Gynecology

## 2021-01-30 ENCOUNTER — Other Ambulatory Visit: Payer: Self-pay

## 2021-01-30 VITALS — BP 118/80 | HR 77 | Ht 68.5 in | Wt 194.2 lb

## 2021-01-30 DIAGNOSIS — Z803 Family history of malignant neoplasm of breast: Secondary | ICD-10-CM | POA: Diagnosis not present

## 2021-01-30 DIAGNOSIS — E890 Postprocedural hypothyroidism: Secondary | ICD-10-CM | POA: Diagnosis not present

## 2021-01-30 DIAGNOSIS — N912 Amenorrhea, unspecified: Secondary | ICD-10-CM | POA: Diagnosis not present

## 2021-01-30 DIAGNOSIS — Z01419 Encounter for gynecological examination (general) (routine) without abnormal findings: Secondary | ICD-10-CM | POA: Diagnosis not present

## 2021-01-30 NOTE — Progress Notes (Signed)
50 y.o. G1P1 Married White or Caucasian female here for annual exam.  Has been diagnosed with breast cancer.  Has seen Dr. Jana Hakim, Dr. Georgette Dover and Dr. Iran Planas.  Has decided to have bilateral mastectomy with sentinel node.  Genetic testing was negative.  Clinical staging IB.  She has decided no to have chemotherapy and tamoxifen.    Has stopped OCPs.  Has not had any bleeding.  Working with naturopathic provider in [place] who has suggested some hormonal testing.  I can order most of this.  Pt aware I do not know about coverage for specific tests she's requested.  Has normal TSh 4/22 with Dr. Virgina Jock.       Sexually active: Yes.    The current method of family planning is going to .    Exercising: Yes.     Walking/yoga Smoker:  no  Health Maintenance: Pap:  10/19/2019 Negative History of abnormal Pap:  no MMG:  12/06/2018 Additional images Colonoscopy:  guidelines reviewed.  Will plan after breast care is completed. TDaP:  2014 Hep C testing: new guidelines reviewed Screening Labs: done 09/2020   reports that she has never smoked. She has never used smokeless tobacco. She reports current alcohol use of about 1.0 - 2.0 standard drink per week. She reports that she does not use drugs.  Past Medical History:  Diagnosis Date   Anxiety    Breast cancer ([place])    Cancer ([place]) 2013 Thyroid   Depression    Endometrial polyp 01/14/2016   Family history of breast cancer 01/14/2021   Fibroid, uterine    History of thyroid cancer 01/14/2021   Infertility, female    Thyroid cancer ([place]) 2013   Urinary incontinence     Past Surgical History:  Procedure Laterality Date   HYSTEROSCOPY WITH D & C  10/13/2017   polyp resection   TOTAL THYROIDECTOMY  2013    Current Outpatient Medications  Medication Sig Dispense Refill   ADDERALL XR 10 MG 24 hr capsule Take 1 capsule by mouth daily.  0   FLUoxetine (PROZAC) 20 MG tablet Take 1 tablet (20 mg total) by mouth daily. 90 tablet 1   levothyroxine  (SYNTHROID, LEVOTHROID) 125 MCG tablet Take 1 tablet by mouth daily. For 2 days a week  3   levothyroxine (SYNTHROID, LEVOTHROID) 137 MCG tablet Take 1 tablet by mouth daily. For 5 days a week  5   Multiple Vitamin (MULTIVITAMIN PO) Take by mouth.     MICROGESTIN 1-20 MG-MCG tablet TAKE 1 TABLET BY MOUTH ONCE DAILY, TAKE CONTINUOUSLY (Patient not taking: Reported on 01/30/2021) 84 tablet 1   No current facility-administered medications for this visit.    Family History  Problem Relation Age of Onset   Breast cancer Mother 81       bilateral mastectomy   Cancer Mother    Vision loss Mother    Diabetes Father    Skin cancer Sister        basal cell carcinoma; head/face   Skin cancer Maternal Grandfather        nose; dx after 92   Breast cancer Other        maternal great aunt x2; dx before 70    Review of Systems  All other systems reviewed and are negative.  Exam:   BP 118/80 (BP Location: Right Arm, Patient Position: Sitting, Cuff Size: Large)   Pulse 77   Ht 5' 8.5" (1.74 m)   Wt 194 lb 3.2 oz (88.1 kg)  BMI 29.10 kg/m   Height: 5' 8.5" (174 cm)  General appearance: alert, cooperative and appears stated age Head: Normocephalic, without obvious abnormality, atraumatic Neck: no adenopathy, supple, symmetrical, trachea midline and thyroid normal to inspection and palpation Lungs: clear to auscultation bilaterally Breasts: normal appearance, no masses or tenderness Heart: regular rate and rhythm Abdomen: soft, non-tender; bowel sounds normal; no masses,  no organomegaly Extremities: extremities normal, atraumatic, no cyanosis or edema Skin: Skin color, texture, turgor normal. No rashes or lesions Lymph nodes: Cervical, supraclavicular, and axillary nodes normal. No abnormal inguinal nodes palpated Neurologic: Grossly normal   Pelvic: External genitalia:  no lesions              Urethra:  normal appearing urethra with no masses, tenderness or lesions               Bartholins and Skenes: normal                 Vagina: normal appearing vagina with normal color and no discharge, no lesions              Cervix: no lesions              Pap taken: No. Bimanual Exam:  Uterus:  normal size, contour, position, consistency, mobility, non-tender              Adnexa: normal adnexa and no mass, fullness, tenderness               Rectovaginal: Confirms               Anus:  normal sphincter tone, no lesions  Chaperone, Octaviano Batty, CMA, was present for exam.  Assessment/Plan: 1. Well woman exam with routine gynecological exam - pap neg with neg HR HPV 2020 - MMG showed breast cancer - colonoscopy guidelines reviewed - BMD not indicated - lab work done 09/2020 with Dr. Virgina Jock  2. Amenorrhea - Prolactin - FSH/LH - Estradiol - Estrone - Estriol  3. Postoperative hypothyroidism - taking thyroid replacement  4. Family history of breast cancer  5.  Clinical Stage IB breast cancer - having definitive surgery with Dr. Gershon Crane scheduled 02/12/2021

## 2021-01-31 LAB — ESTRADIOL: Estradiol: 41.2 pg/mL

## 2021-02-03 ENCOUNTER — Encounter: Payer: Self-pay | Admitting: *Deleted

## 2021-02-03 DIAGNOSIS — Z170 Estrogen receptor positive status [ER+]: Secondary | ICD-10-CM | POA: Diagnosis not present

## 2021-02-03 DIAGNOSIS — C50512 Malignant neoplasm of lower-outer quadrant of left female breast: Secondary | ICD-10-CM | POA: Diagnosis not present

## 2021-02-03 NOTE — H&P (Signed)
Subjective:     Patient ID: Renee [name] is a 50 y.o. female.   HPI   Here for follow up discussion breast reconstruction prior to planned bilateral mastectomies. Presented following screening MMG with possible left breast mass. US showed a 3.7 cm mass in left breast at 1 o'clock; left axilla normal. Biopsy demonstrated IDC with extracellular mucin, ER/PR+, Her2 equivocal by IHC, negative by FISH. Mammaprint high risk. Patient declining chemotherapy and hormonal presently. Plans to continue with naturopathic methods, has been on cleansing tea, which she has held for surgery.   Patient has elected for bilateral mastectomies.    Mother and MA with breast ca. Genetics negative on BRCAPlus Panel. Results of pan-cancer panel are pending. Mother, who is retired Therapist, sports, underwent bilateral mastectomies with TE then silicone implant reconstruction with Drs. Streck and [employer].   Current 38 D. Wt stable.   PMH significant for thyroid cancer status post total thyroidectomy and LN sampling (with positive lymph nodes) 2013 in [employer] [place].   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 Husband is VP of sales in the furniture business. Lives with spouse and teenage son.   Review of Systems Remainder 12 point review negative    Objective:   Physical Exam Cardiovascular:     Rate and Rhythm: Normal rate and regular rhythm.     Heart sounds: Normal heart sounds.  Pulmonary:     Effort: Pulmonary effort is normal.     Breath sounds: Normal breath sounds.  Chest:  Breasts:     Right: No axillary adenopathy.     Left: No axillary adenopathy.      Abdominal:     Palpations: Abdomen is soft.     Comments: Sufficient soft tissue for reconstruction  Lymphadenopathy:     Upper Body:     Right upper body: No axillary adenopathy.     Left upper body: No axillary adenopathy.  Skin:    Comments: Fitzpatrick 3       Breasts: Grade 3 ptosis  bilateral Palpable mass left breast 1 o clock few cm from nipple Right > left volume  Sn to nipple R 30 L 32 cm BW R 24 L 24 cm Nipple to IMF R 14 L 16 cm      Assessment:     Left breast cancer UOQ ER+    Plan:     Reviewed SSM vs NSM, both will be asensate and not stimulate. Reviewed with both risks mastectomy flap necrosis requiring additional surgery. Per patient given tumor location Dr. Georgette Dover has advised against nipple sparing mastectomy on left. Given breast size and ptosis recommend SRM. Reviewed anchor type scars.   Discussed use of acellular dermis in reconstruction, cadaveric source, incorporation over several weeks, risk that if has seroma or infection can act as additional nidus for infection if not incorporated.    Discussed prepectoral vs sub pectoral reconstruction. Discussed with patient and benefit of this is no animation deformity, may be less pain. Risk may be more visible rippling over upper poles, greater need of ADM. Reviewed pre pectoral would require larger amount acellular dermis, more drains. Discussed any type reconstruction also risks long term displacement implant and visible rippling. If prepectoral counseled I would recommend she be comfortable with silicone implants as more options that have less rippling. She agrees to prepectoral placement.   Reviewed reconstruction will be asensate and not stimulate. Reviewed additional risks including but not limited to risks  mastectomy flap necrosis requiring additional surgery, seroma, hematoma, asymmetry, need to additional procedures, fat necrosis, DVT/PE, damage to adjacent structures, cardiopulmonary complications.   Reviewed her additional questions regarding BIA ALCL, silicone implant surveillance recommendations, concerns for arthritis and implants. Reviewed TE placement does not preclude DIEP in future.   Second to [employer] provided. Drain teaching completed. Rx for oxycodone, bactrim, robaxin given.

## 2021-02-04 DIAGNOSIS — C50912 Malignant neoplasm of unspecified site of left female breast: Secondary | ICD-10-CM | POA: Diagnosis not present

## 2021-02-04 DIAGNOSIS — Z9011 Acquired absence of right breast and nipple: Secondary | ICD-10-CM | POA: Diagnosis not present

## 2021-02-06 NOTE — Pre-Procedure Instructions (Signed)
Surgical Instructions   Your procedure is scheduled on Thursday, September 8th. Report to [facility] Main Entrance "A" at 09:00 A.M., then check in with the Admitting office. Call this number if you have problems the morning of surgery: [phone]   If you have any questions prior to your surgery date call [phone]: Open Monday-Friday 8am-4pm   Remember: Do not eat after midnight the night before your surgery  You may drink clear liquids until 08:00 AM the morning of your surgery.   Clear liquids allowed are: Water, Non-Citrus Juices (without pulp), Carbonated Beverages, Clear Tea, Black Coffee ONLY (NO MILK, CREAM OR POWDERED CREAMER of any kind), and Gatorade   Take these medicines the morning of surgery with A SIP OF WATER  FLUoxetine (PROZAC) levothyroxine (SYNTHROID, LEVOTHROID)   As of today, STOP taking any Aspirin (unless otherwise instructed by your surgeon) Aleve, Naproxen, Ibuprofen, Motrin, Advil, Goody's, BC's, all herbal medications, fish oil, and all vitamins.          Do not wear jewelry or makeup Do not wear lotions, powders, perfumes, or deodorant. Do not shave 48 hours prior to surgery.   Do not bring valuables to the hospital. [facility] is not responsible for any belongings or valuables. Do not wear nail polish, gel polish, artificial nails, or any other type of covering on natural nails including finger and toenails. If patients have artificial nails, gel coating, etc. that need to be removed by a nail salon please have this removed prior to surgery or surgery may need to be canceled/delayed if the surgeon/ anesthesia feels like the patient is unable to be adequately monitored.               Do NOT Smoke (Tobacco/Vaping)  24 hours prior to your procedure If you use a CPAP at night, you may bring all equipment for your overnight stay.   Contacts, glasses, dentures or bridgework may not be worn into surgery, please bring cases for these belongings   For  patients admitted to the hospital, discharge time will be determined by your treatment team.   Patients discharged the day of surgery will not be allowed to drive home, and someone needs to stay with them for 24 hours.  ONLY 1 SUPPORT PERSON MAY BE PRESENT WHILE YOU ARE IN SURGERY. IF YOU ARE TO BE ADMITTED ONCE YOU ARE IN YOUR ROOM YOU WILL BE ALLOWED TWO (2) VISITORS.  Minor children may have two parents present. Special consideration for safety and communication needs will be reviewed on a case by case basis.  Special instructions:    Oral Hygiene is also important to reduce your risk of infection.  Remember - BRUSH YOUR TEETH THE MORNING OF SURGERY WITH YOUR REGULAR TOOTHPASTE   [facility]- Preparing For Surgery  Before surgery, you can play an important role. Because skin is not sterile, your skin needs to be as free of germs as possible. You can reduce the number of germs on your skin by washing with CHG (chlorahexidine gluconate) Soap before surgery.  CHG is an antiseptic cleaner which kills germs and bonds with the skin to continue killing germs even after washing.     Please do not use if you have an allergy to CHG or antibacterial soaps. If your skin becomes reddened/irritated stop using the CHG.  Do not shave (including legs and underarms) for at least 48 hours prior to first CHG shower. It is OK to shave your face.  Please follow these instructions  carefully.     Shower the NIGHT BEFORE SURGERY and the MORNING OF SURGERY with CHG Soap.   If you chose to wash your hair, wash your hair first as usual with your normal shampoo. After you shampoo, rinse your hair and body thoroughly to remove the shampoo.  Then [employer] and genitals (private parts) with your normal soap and rinse thoroughly to remove soap.  After that Use CHG Soap as you would any other liquid soap. You can apply CHG directly to the skin and wash gently with a scrungie or a clean washcloth.   Apply the CHG Soap to  your body ONLY FROM THE NECK DOWN.  Do not use on open wounds or open sores. Avoid contact with your eyes, ears, mouth and genitals (private parts). Wash Face and genitals (private parts)  with your normal soap.   Wash thoroughly, paying special attention to the area where your surgery will be performed.  Thoroughly rinse your body with warm water from the neck down.  DO NOT shower/wash with your normal soap after using and rinsing off the CHG Soap.  Pat yourself dry with a CLEAN TOWEL.  Wear CLEAN PAJAMAS to bed the night before surgery  Place CLEAN SHEETS on your bed the night before your surgery  DO NOT SLEEP WITH PETS.   Day of Surgery:  Take a shower with CHG soap. Wear Clean/Comfortable clothing the morning of surgery Do not apply any deodorants/lotions.   Remember to brush your teeth WITH YOUR REGULAR TOOTHPASTE.   Please read over the following fact sheets that you were given.

## 2021-02-10 ENCOUNTER — Other Ambulatory Visit: Payer: Self-pay

## 2021-02-10 ENCOUNTER — Encounter (HOSPITAL_COMMUNITY)
Admission: RE | Admit: 2021-02-10 | Discharge: 2021-02-10 | Disposition: A | Discharge status: Home / Self Care | Payer: BC Managed Care – PPO | Source: Ambulatory Visit | Attending: Surgery | Admitting: Surgery

## 2021-02-10 ENCOUNTER — Encounter (HOSPITAL_COMMUNITY): Payer: Self-pay

## 2021-02-10 ENCOUNTER — Ambulatory Visit: Payer: Self-pay | Admitting: Surgery

## 2021-02-10 ENCOUNTER — Other Ambulatory Visit (HOSPITAL_BASED_OUTPATIENT_CLINIC_OR_DEPARTMENT_OTHER): Payer: Self-pay | Admitting: *Deleted

## 2021-02-10 ENCOUNTER — Ambulatory Visit: Payer: Self-pay | Admitting: Genetic Counselor

## 2021-02-10 ENCOUNTER — Other Ambulatory Visit (HOSPITAL_BASED_OUTPATIENT_CLINIC_OR_DEPARTMENT_OTHER): Payer: Self-pay | Admitting: Obstetrics & Gynecology

## 2021-02-10 ENCOUNTER — Telehealth: Payer: Self-pay | Admitting: Genetic Counselor

## 2021-02-10 DIAGNOSIS — N912 Amenorrhea, unspecified: Secondary | ICD-10-CM

## 2021-02-10 DIAGNOSIS — C50412 Malignant neoplasm of upper-outer quadrant of left female breast: Secondary | ICD-10-CM

## 2021-02-10 DIAGNOSIS — Z20822 Contact with and (suspected) exposure to covid-19: Secondary | ICD-10-CM | POA: Insufficient documentation

## 2021-02-10 DIAGNOSIS — Z170 Estrogen receptor positive status [ER+]: Secondary | ICD-10-CM

## 2021-02-10 DIAGNOSIS — Z803 Family history of malignant neoplasm of breast: Secondary | ICD-10-CM

## 2021-02-10 DIAGNOSIS — Z01812 Encounter for preprocedural laboratory examination: Secondary | ICD-10-CM | POA: Insufficient documentation

## 2021-02-10 DIAGNOSIS — Z1379 Encounter for other screening for genetic and chromosomal anomalies: Secondary | ICD-10-CM

## 2021-02-10 HISTORY — DX: Unspecified osteoarthritis, unspecified site: M19.90

## 2021-02-10 HISTORY — DX: Personal history of urinary calculi: Z87.442

## 2021-02-10 LAB — SARS CORONAVIRUS 2 (TAT 6-24 HRS): SARS Coronavirus 2: NEGATIVE

## 2021-02-10 NOTE — Progress Notes (Signed)
HPI:  Ms. Lauderbaugh was previously seen in the [facility] due to a personal and family history of cancer and concerns regarding a hereditary predisposition to cancer. Please refer to our prior cancer genetics clinic note for more information regarding our discussion, assessment and recommendations, at the time. Ms. Kandler recent genetic test results were disclosed to her, as were recommendations warranted by these results. These results and recommendations are discussed in more detail below.  CANCER HISTORY:  Oncology History  Malignant neoplasm of upper-outer quadrant of left breast in female, estrogen receptor positive ([place])  01/08/2021 Initial Diagnosis   Malignant neoplasm of upper-outer quadrant of left breast in female, estrogen receptor positive ([place])   01/14/2021 Cancer Staging   Staging form: Breast, AJCC 8th Edition - Clinical: Stage IB (cT2, cN0, cM0, G2, ER+, PR+, HER2-) - Signed by Chauncey Cruel, MD on 01/14/2021 Histologic grading system: 3 grade system   01/20/2021 Genetic Testing   Negative hereditary cancer genetic testing: no pathogenic variants detected in [facility] BRCAPlus Panel.  The report date is January 20, 2021.   The BRCAplus panel offered by [employer] and includes sequencing and deletion/duplication analysis for the following 8 genes: ATM, BRCA1, BRCA2, CDH1, CHEK2, PALB2, PTEN, and TP53.  Results of pan-cancer panel are pending.    02/06/2021 Genetic Testing   Negative hereditary cancer genetic testing: no pathogenic variants detected in [facility] BRCAPlus Panel or CancerNext-Expanded +RNAinsight Panel.  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  The report dates are January 20, 2021 and February 06, 2021, respectively.   The BRCAplus panel offered by [employer] and includes sequencing and deletion/duplication analysis for the following 8 genes: ATM, BRCA1, BRCA2, CDH1, CHEK2, PALB2, PTEN, and TP53.  The  CancerNext-Expanded gene panel offered by [facility] and includes sequencing, rearrangement, and RNA analysis for the following 77 genes: AIP, ALK, APC, ATM, AXIN2, BAP1, BARD1, BLM, BMPR1A, BRCA1, BRCA2, BRIP1, CDC73, CDH1, CDK4, CDKN1B, CDKN2A, CHEK2, CTNNA1, DICER1, FANCC, FH, FLCN, GALNT12, KIF1B, LZTR1, MAX, MEN1, MET, MLH1, MSH2, MSH3, MSH6, MUTYH, NBN, NF1, NF2, NTHL1, PALB2, PHOX2B, PMS2, POT1, PRKAR1A, PTCH1, PTEN, RAD51C, RAD51D, RB1, RECQL, RET, SDHA, SDHAF2, SDHB, SDHC, SDHD, SMAD4, SMARCA4, SMARCB1, SMARCE1, STK11, SUFU, TMEM127, TP53, TSC1, TSC2, VHL and XRCC2 (sequencing and deletion/duplication); EGFR, EGLN1, HOXB13, KIT, MITF, PDGFRA, POLD1, and POLE (sequencing only); EPCAM and GREM1 (deletion/duplication only).      FAMILY HISTORY:  We obtained a detailed, 4-generation family history.  Significant diagnoses are listed below: Family History  Problem Relation Age of Onset   Breast cancer Mother 37       bilateral mastectomy   Skin cancer Sister        basal cell carcinoma; head/face   Skin cancer Maternal Grandfather        nose; dx after 21   Breast cancer Other        maternal great aunt x2; dx before 22   Ms. Cizek is unaware of previous family history of genetic testing for hereditary cancer risks besides that mentioned above.There is no reported Ashkenazi Jewish ancestry. There is no known consanguinity.  GENETIC TEST RESULTS: Genetic testing reported out on February 06, 2021. The [facility] CancerNext-Expanded +RNAinsight Panel found no pathogenic mutations. The CancerNext-Expanded gene panel offered by [facility] and includes sequencing, rearrangement, and RNA analysis for the following 77 genes: AIP, ALK, APC, ATM, AXIN2, BAP1, BARD1, BLM, BMPR1A, BRCA1, BRCA2, BRIP1, CDC73, CDH1, CDK4, CDKN1B, CDKN2A, CHEK2, CTNNA1, DICER1, FANCC, FH,  FLCN, GALNT12, KIF1B, LZTR1, MAX, MEN1, MET, MLH1, MSH2, MSH3, MSH6, MUTYH, NBN, NF1, NF2, NTHL1, PALB2, PHOX2B, PMS2, POT1,  PRKAR1A, PTCH1, PTEN, RAD51C, RAD51D, RB1, RECQL, RET, SDHA, SDHAF2, SDHB, SDHC, SDHD, SMAD4, SMARCA4, SMARCB1, SMARCE1, STK11, SUFU, TMEM127, TP53, TSC1, TSC2, VHL and XRCC2 (sequencing and deletion/duplication); EGFR, EGLN1, HOXB13, KIT, MITF, PDGFRA, POLD1, and POLE (sequencing only); EPCAM and GREM1 (deletion/duplication only).   The test report has been scanned into EPIC and is located under the Molecular Pathology section of the Results Review tab.  A portion of the result report is included below for reference.     We discussed with Ms. Jester that because current genetic testing is not perfect, it is possible there may be a gene mutation in one of these genes that current testing cannot detect, but that chance is small.  We also discussed, that there could be another gene that has not yet been discovered, or that we have not yet tested, that is responsible for the cancer diagnoses in the family. It is also possible there is a hereditary cause for the cancer in the family that Ms. Cullinan did not inherit and therefore was not identified in her testing.  Therefore, it is important to remain in touch with cancer genetics in the future so that we can continue to offer Ms. Boyde the most up to date genetic testing.   Genetic testing did identify a variant of uncertain significance (VUS) in the PDGFRA gene called c.1421C>T (p.T474M).  At this time, it is unknown if this variant is associated with increased cancer risk or if this is a normal finding, but most variants such as this get reclassified to being inconsequential. It should not be used to make medical management decisions. With time, we suspect the lab will determine the significance of this variant, if any. If we do learn more about it, we will try to contact Ms. Marietta to discuss it further. However, it is important to stay in touch with [place] periodically and keep the address and phone number up to date.  ADDITIONAL GENETIC TESTING:  We discussed with Ms. Neff that her genetic testing was fairly extensive.  If there are genes identified to increase cancer risk that can be analyzed in the future, we would be happy to discuss and coordinate this testing at that time.    CANCER SCREENING RECOMMENDATIONS: Ms. Weitzman test result is considered negative (normal).  This means that we have not identified a hereditary cause for her personal history of cancer at this time. Most cancers happen by chance and this negative test suggests that her cancer may fall into this category.    While reassuring, this does not definitively rule out a hereditary predisposition to cancer. It is still possible that there could be genetic mutations that are undetectable by current technology. There could be genetic mutations in genes that have not been tested or identified to increase cancer risk.  Therefore, it is recommended she continue to follow the cancer management and screening guidelines provided by her oncology and primary healthcare provider.   An individual's cancer risk and medical management are not determined by genetic test results alone. Overall cancer risk assessment incorporates additional factors, including personal medical history, family history, and any available genetic information that may result in a personalized plan for cancer prevention and surveillance  RECOMMENDATIONS FOR FAMILY MEMBERS:  Individuals in this family might be at some increased risk of developing cancer, over the general population risk, simply due to the family  history of cancer.  We recommended women in this family have a yearly mammogram beginning at age 40, or 9 years younger than the earliest onset of cancer, an annual clinical breast exam, and perform monthly breast self-exams. Women in this family should also have a gynecological exam as recommended by their primary provider. Family members should be referred for colonoscopy starting at age 58, or earlier,  as recommended by their providers.    FOLLOW-UP: Lastly, we discussed with Ms. Peddie that cancer genetics is a rapidly advancing field and it is possible that new genetic tests will be appropriate for her and/or her family members in the future. We encouraged her to remain in contact with cancer genetics on an annual basis so we can update her personal and family histories and let her know of advances in cancer genetics that may benefit this family.   Our contact number was provided. Ms. Valencia questions were answered to her satisfaction, and she knows she is welcome to call us at anytime with additional questions or concerns.     Haydn Hutsell M. Joette Catching, [place], [facility] Genetic Counselor [email or web address] (P) [phone]

## 2021-02-10 NOTE — Progress Notes (Signed)
PCP - Shon Baton, MD w/ [place] Cardiologist - Denies Oncologist- Lurline Del, MD  PPM/ICD - Denies  Chest x-ray - N/A EKG - N/A Stress Test - Denies ECHO - Denies Cardiac Cath - Denies  Sleep Study - Denies  DM- Denies  Blood Thinner Instructions: N/A Aspirin Instructions: N/A  ERAS Protcol - Yes PRE-SURGERY Ensure or G2- Not ordered  COVID TEST- 02/10/21; results pending   Anesthesia review: No  Patient denies shortness of breath, fever, cough and chest pain at PAT appointment   All instructions explained to the patient, with a verbal understanding of the material. Patient agrees to go over the instructions while at home for a better understanding.The opportunity to ask questions was provided.

## 2021-02-10 NOTE — Telephone Encounter (Addendum)
Revealed negative genetic testing and variant of uncertain significance in PDGFRA.  Discussed that we do not know why she has breast cancer or why there is cancer in the family. It could be due to a different gene that we are not testing, or maybe our current technology may not be able to pick something up.  It will be important for her to keep in contact with genetics to keep up with whether additional testing may be needed.

## 2021-02-12 ENCOUNTER — Telehealth: Payer: BC Managed Care – PPO | Admitting: Oncology

## 2021-02-12 ENCOUNTER — Ambulatory Visit (HOSPITAL_COMMUNITY)
Admission: RE | Admit: 2021-02-12 | Discharge: 2021-02-12 | Disposition: A | Discharge status: Home / Self Care | Payer: BC Managed Care – PPO | Source: Ambulatory Visit | Attending: Surgery | Admitting: Surgery

## 2021-02-12 ENCOUNTER — Observation Stay (HOSPITAL_COMMUNITY)
Admission: RE | Admit: 2021-02-12 | Discharge: 2021-02-13 | Disposition: A | Discharge status: Home / Self Care | Payer: BC Managed Care – PPO | Attending: Surgery | Admitting: Surgery

## 2021-02-12 ENCOUNTER — Encounter: Payer: Self-pay | Admitting: Oncology

## 2021-02-12 ENCOUNTER — Encounter (HOSPITAL_COMMUNITY)
Admission: RE | Disposition: A | Discharge status: Home / Self Care | Payer: Self-pay | Source: Home / Self Care | Attending: Surgery

## 2021-02-12 ENCOUNTER — Ambulatory Visit (HOSPITAL_COMMUNITY): Payer: BC Managed Care – PPO

## 2021-02-12 ENCOUNTER — Other Ambulatory Visit: Payer: Self-pay

## 2021-02-12 ENCOUNTER — Encounter (HOSPITAL_COMMUNITY): Payer: Self-pay | Admitting: Surgery

## 2021-02-12 DIAGNOSIS — Z170 Estrogen receptor positive status [ER+]: Secondary | ICD-10-CM | POA: Insufficient documentation

## 2021-02-12 DIAGNOSIS — E039 Hypothyroidism, unspecified: Secondary | ICD-10-CM | POA: Diagnosis not present

## 2021-02-12 DIAGNOSIS — C50412 Malignant neoplasm of upper-outer quadrant of left female breast: Secondary | ICD-10-CM | POA: Diagnosis not present

## 2021-02-12 DIAGNOSIS — Z9889 Other specified postprocedural states: Secondary | ICD-10-CM

## 2021-02-12 DIAGNOSIS — G8918 Other acute postprocedural pain: Secondary | ICD-10-CM | POA: Diagnosis not present

## 2021-02-12 DIAGNOSIS — C50212 Malignant neoplasm of upper-inner quadrant of left female breast: Secondary | ICD-10-CM | POA: Diagnosis not present

## 2021-02-12 DIAGNOSIS — E890 Postprocedural hypothyroidism: Secondary | ICD-10-CM | POA: Diagnosis not present

## 2021-02-12 DIAGNOSIS — C50911 Malignant neoplasm of unspecified site of right female breast: Secondary | ICD-10-CM | POA: Diagnosis present

## 2021-02-12 DIAGNOSIS — N62 Hypertrophy of breast: Secondary | ICD-10-CM | POA: Diagnosis not present

## 2021-02-12 DIAGNOSIS — N6081 Other benign mammary dysplasias of right breast: Secondary | ICD-10-CM | POA: Diagnosis not present

## 2021-02-12 DIAGNOSIS — C50912 Malignant neoplasm of unspecified site of left female breast: Secondary | ICD-10-CM | POA: Diagnosis not present

## 2021-02-12 DIAGNOSIS — Z85850 Personal history of malignant neoplasm of thyroid: Secondary | ICD-10-CM | POA: Insufficient documentation

## 2021-02-12 DIAGNOSIS — R112 Nausea with vomiting, unspecified: Secondary | ICD-10-CM

## 2021-02-12 HISTORY — PX: BREAST RECONSTRUCTION WITH PLACEMENT OF TISSUE EXPANDER AND ALLODERM: SHX6805

## 2021-02-12 HISTORY — PX: MASTECTOMY W/ SENTINEL NODE BIOPSY: SHX2001

## 2021-02-12 HISTORY — PX: SIMPLE MASTECTOMY WITH AXILLARY SENTINEL NODE BIOPSY: SHX6098

## 2021-02-12 LAB — POCT PREGNANCY, URINE: Preg Test, Ur: NEGATIVE

## 2021-02-12 LAB — ESTRIOL: Estriol: <0.1 ng/mL

## 2021-02-12 LAB — PROLACTIN: Prolactin: 18.2 ng/mL (ref 4.8–23.3)

## 2021-02-12 LAB — FSH/LH
FSH: 13.6 m[IU]/mL
LH: 22.4 m[IU]/mL

## 2021-02-12 LAB — ESTRONE: Estrone: 146 pg/mL

## 2021-02-12 SURGERY — MASTECTOMY WITH SENTINEL LYMPH NODE BIOPSY
Anesthesia: Regional | Site: Breast | Laterality: Right

## 2021-02-12 MED ORDER — OXYCODONE HCL 5 MG PO TABS
5.0000 mg | ORAL_TABLET | ORAL | Status: DC | PRN
  Administered 2021-02-12 – 2021-02-13 (×3): 5 mg via ORAL
  Filled 2021-02-12 (×2): qty 2
  Filled 2021-02-12: qty 1

## 2021-02-12 MED ORDER — ROCURONIUM BROMIDE 10 MG/ML (PF) SYRINGE
PREFILLED_SYRINGE | INTRAVENOUS | Status: AC
  Filled 2021-02-12: qty 20

## 2021-02-12 MED ORDER — FENTANYL CITRATE (PF) 100 MCG/2ML IJ SOLN
100.0000 ug | Freq: Once | INTRAMUSCULAR | Status: AC
  Filled 2021-02-12: qty 2

## 2021-02-12 MED ORDER — EPHEDRINE SULFATE-NACL 50-0.9 MG/10ML-% IV SOSY
PREFILLED_SYRINGE | INTRAVENOUS | Status: DC | PRN
  Administered 2021-02-12 (×3): 5 mg via INTRAVENOUS

## 2021-02-12 MED ORDER — FENTANYL CITRATE (PF) 100 MCG/2ML IJ SOLN
INTRAMUSCULAR | Status: AC
  Filled 2021-02-12: qty 2

## 2021-02-12 MED ORDER — KETOROLAC TROMETHAMINE 30 MG/ML IJ SOLN
30.0000 mg | Freq: Three times a day (TID) | INTRAMUSCULAR | Status: DC
  Administered 2021-02-12 – 2021-02-13 (×2): 30 mg via INTRAVENOUS
  Filled 2021-02-12 (×2): qty 1

## 2021-02-12 MED ORDER — SODIUM CHLORIDE 0.9 % IV SOLN
INTRAVENOUS | Status: DC | PRN
  Administered 2021-02-12: 500 mL

## 2021-02-12 MED ORDER — ACETAMINOPHEN 650 MG RE SUPP: 650.0000 mg | Freq: Four times a day (QID) | RECTAL | Status: DC | PRN

## 2021-02-12 MED ORDER — MIDAZOLAM HCL 2 MG/2ML IJ SOLN
INTRAMUSCULAR | Status: AC
  Filled 2021-02-12: qty 2

## 2021-02-12 MED ORDER — ACETAMINOPHEN 325 MG PO TABS
650.0000 mg | ORAL_TABLET | Freq: Four times a day (QID) | ORAL | Status: DC | PRN
  Administered 2021-02-12 – 2021-02-13 (×3): 650 mg via ORAL
  Filled 2021-02-12 (×3): qty 2

## 2021-02-12 MED ORDER — LACTATED RINGERS IV SOLN: INTRAVENOUS | Status: DC | PRN

## 2021-02-12 MED ORDER — FENTANYL CITRATE (PF) 250 MCG/5ML IJ SOLN
INTRAMUSCULAR | Status: DC | PRN
  Administered 2021-02-12 (×5): 50 ug via INTRAVENOUS

## 2021-02-12 MED ORDER — FENTANYL CITRATE (PF) 250 MCG/5ML IJ SOLN
INTRAMUSCULAR | Status: AC
  Filled 2021-02-12: qty 5

## 2021-02-12 MED ORDER — EPHEDRINE 5 MG/ML INJ
INTRAVENOUS | Status: AC
  Filled 2021-02-12: qty 10

## 2021-02-12 MED ORDER — SODIUM CHLORIDE (PF) 0.9 % IJ SOLN
INTRAMUSCULAR | Status: AC
  Filled 2021-02-12: qty 20

## 2021-02-12 MED ORDER — LIDOCAINE 2% (20 MG/ML) 5 ML SYRINGE
INTRAMUSCULAR | Status: AC
  Filled 2021-02-12: qty 5

## 2021-02-12 MED ORDER — 0.9 % SODIUM CHLORIDE (POUR BTL) OPTIME
TOPICAL | Status: DC | PRN
  Administered 2021-02-12: 1000 mL

## 2021-02-12 MED ORDER — AMPHETAMINE-DEXTROAMPHET ER 10 MG PO CP24: 10.0000 mg | ORAL_CAPSULE | ORAL | Status: DC

## 2021-02-12 MED ORDER — ONDANSETRON HCL 4 MG/2ML IJ SOLN
4.0000 mg | Freq: Four times a day (QID) | INTRAMUSCULAR | Status: DC | PRN
  Administered 2021-02-12 – 2021-02-13 (×3): 4 mg via INTRAVENOUS
  Filled 2021-02-12 (×3): qty 2

## 2021-02-12 MED ORDER — ONDANSETRON 4 MG PO TBDP: 4.0000 mg | ORAL_TABLET | Freq: Four times a day (QID) | ORAL | Status: DC | PRN

## 2021-02-12 MED ORDER — ONDANSETRON HCL 4 MG/2ML IJ SOLN
INTRAMUSCULAR | Status: AC
  Filled 2021-02-12: qty 2

## 2021-02-12 MED ORDER — POTASSIUM CHLORIDE IN NACL 20-0.9 MEQ/L-% IV SOLN: INTRAVENOUS | Status: DC

## 2021-02-12 MED ORDER — MIDAZOLAM HCL 2 MG/2ML IJ SOLN
2.0000 mg | Freq: Once | INTRAMUSCULAR | Status: AC
  Filled 2021-02-12: qty 2

## 2021-02-12 MED ORDER — TECHNETIUM TC 99M TILMANOCEPT KIT
1.0000 | PACK | Freq: Once | INTRAVENOUS | Status: AC | PRN
  Administered 2021-02-12: 1 via INTRADERMAL

## 2021-02-12 MED ORDER — LEVOTHYROXINE SODIUM 137 MCG PO TABS
137.0000 ug | ORAL_TABLET | ORAL | Status: DC
  Administered 2021-02-13: 137 ug via ORAL
  Filled 2021-02-12: qty 1

## 2021-02-12 MED ORDER — CEFAZOLIN SODIUM-DEXTROSE 2-4 GM/100ML-% IV SOLN
2.0000 g | INTRAVENOUS | Status: AC
  Administered 2021-02-12 (×2): 2 g via INTRAVENOUS
  Filled 2021-02-12: qty 100

## 2021-02-12 MED ORDER — PHENYLEPHRINE 40 MCG/ML (10ML) SYRINGE FOR IV PUSH (FOR BLOOD PRESSURE SUPPORT)
PREFILLED_SYRINGE | INTRAVENOUS | Status: AC
  Filled 2021-02-12: qty 10

## 2021-02-12 MED ORDER — PHENYLEPHRINE HCL-NACL 20-0.9 MG/250ML-% IV SOLN
INTRAVENOUS | Status: DC | PRN
  Administered 2021-02-12: 20 ug/min via INTRAVENOUS

## 2021-02-12 MED ORDER — SCOPOLAMINE 1 MG/3DAYS TD PT72
1.0000 | MEDICATED_PATCH | TRANSDERMAL | Status: DC
  Administered 2021-02-12: 1.5 mg via TRANSDERMAL
  Filled 2021-02-12: qty 1

## 2021-02-12 MED ORDER — FLUOXETINE HCL 10 MG PO CAPS
10.0000 mg | ORAL_CAPSULE | Freq: Every day | ORAL | Status: DC
  Filled 2021-02-12: qty 1

## 2021-02-12 MED ORDER — LEVOTHYROXINE SODIUM 25 MCG PO TABS: 125.0000 ug | ORAL_TABLET | ORAL | Status: DC

## 2021-02-12 MED ORDER — BUPIVACAINE LIPOSOME 1.3 % IJ SUSP
INTRAMUSCULAR | Status: DC | PRN
  Administered 2021-02-12 (×2): 10 mL via PERINEURAL

## 2021-02-12 MED ORDER — MORPHINE SULFATE (PF) 2 MG/ML IV SOLN
2.0000 mg | INTRAVENOUS | Status: DC | PRN
  Administered 2021-02-12 – 2021-02-13 (×2): 2 mg via INTRAVENOUS
  Filled 2021-02-12: qty 2
  Filled 2021-02-12: qty 1

## 2021-02-12 MED ORDER — METHOCARBAMOL 500 MG PO TABS
500.0000 mg | ORAL_TABLET | Freq: Four times a day (QID) | ORAL | Status: DC | PRN
  Administered 2021-02-13: 500 mg via ORAL
  Filled 2021-02-12: qty 1

## 2021-02-12 MED ORDER — CHLORHEXIDINE GLUCONATE 0.12 % MT SOLN
15.0000 mL | Freq: Once | OROMUCOSAL | Status: AC
  Administered 2021-02-12: 15 mL via OROMUCOSAL
  Filled 2021-02-12: qty 15

## 2021-02-12 MED ORDER — SUGAMMADEX SODIUM 200 MG/2ML IV SOLN
INTRAVENOUS | Status: DC | PRN
  Administered 2021-02-12: 200 mg via INTRAVENOUS

## 2021-02-12 MED ORDER — PHENYLEPHRINE 40 MCG/ML (10ML) SYRINGE FOR IV PUSH (FOR BLOOD PRESSURE SUPPORT)
PREFILLED_SYRINGE | INTRAVENOUS | Status: DC | PRN
Start: 2021-02-12 — End: 2021-02-12
  Administered 2021-02-12: 80 ug via INTRAVENOUS
  Administered 2021-02-12: 40 ug via INTRAVENOUS
  Administered 2021-02-12: 120 ug via INTRAVENOUS

## 2021-02-12 MED ORDER — CEFAZOLIN SODIUM 1 G IJ SOLR
INTRAMUSCULAR | Status: AC
  Filled 2021-02-12: qty 20

## 2021-02-12 MED ORDER — DOCUSATE SODIUM 100 MG PO CAPS
100.0000 mg | ORAL_CAPSULE | Freq: Two times a day (BID) | ORAL | Status: DC
  Administered 2021-02-12: 100 mg via ORAL
  Filled 2021-02-12: qty 1

## 2021-02-12 MED ORDER — CEFAZOLIN SODIUM-DEXTROSE 1-4 GM/50ML-% IV SOLN
1.0000 g | Freq: Three times a day (TID) | INTRAVENOUS | Status: DC
  Administered 2021-02-12 – 2021-02-13 (×2): 1 g via INTRAVENOUS
  Filled 2021-02-12 (×2): qty 50

## 2021-02-12 MED ORDER — ORAL CARE MOUTH RINSE: 15.0000 mL | Freq: Once | OROMUCOSAL | Status: AC

## 2021-02-12 MED ORDER — AMISULPRIDE (ANTIEMETIC) 5 MG/2ML IV SOLN
INTRAVENOUS | Status: AC
  Filled 2021-02-12: qty 4

## 2021-02-12 MED ORDER — MIDAZOLAM HCL 2 MG/2ML IJ SOLN
INTRAMUSCULAR | Status: AC
  Administered 2021-02-12: 2 mg via INTRAVENOUS
  Filled 2021-02-12: qty 2

## 2021-02-12 MED ORDER — FENTANYL CITRATE (PF) 100 MCG/2ML IJ SOLN
INTRAMUSCULAR | Status: AC
  Administered 2021-02-12: 100 ug via INTRAVENOUS
  Filled 2021-02-12: qty 2

## 2021-02-12 MED ORDER — LIDOCAINE 2% (20 MG/ML) 5 ML SYRINGE
INTRAMUSCULAR | Status: DC | PRN
  Administered 2021-02-12: 20 mg via INTRAVENOUS

## 2021-02-12 MED ORDER — PROPOFOL 10 MG/ML IV BOLUS
INTRAVENOUS | Status: DC | PRN
  Administered 2021-02-12: 100 mg via INTRAVENOUS

## 2021-02-12 MED ORDER — ONDANSETRON HCL 4 MG/2ML IJ SOLN
INTRAMUSCULAR | Status: DC | PRN
  Administered 2021-02-12: 4 mg via INTRAVENOUS

## 2021-02-12 MED ORDER — AMISULPRIDE (ANTIEMETIC) 5 MG/2ML IV SOLN
10.0000 mg | Freq: Once | INTRAVENOUS | Status: AC
  Administered 2021-02-12: 10 mg via INTRAVENOUS

## 2021-02-12 MED ORDER — ACETAMINOPHEN 500 MG PO TABS
1000.0000 mg | ORAL_TABLET | ORAL | Status: AC
  Administered 2021-02-12: 1000 mg via ORAL
  Filled 2021-02-12: qty 2

## 2021-02-12 MED ORDER — FENTANYL CITRATE (PF) 100 MCG/2ML IJ SOLN
25.0000 ug | INTRAMUSCULAR | Status: DC | PRN
  Administered 2021-02-12 (×3): 50 ug via INTRAVENOUS

## 2021-02-12 MED ORDER — ENOXAPARIN SODIUM 40 MG/0.4ML IJ SOSY
40.0000 mg | PREFILLED_SYRINGE | INTRAMUSCULAR | Status: DC
  Administered 2021-02-13: 40 mg via SUBCUTANEOUS
  Filled 2021-02-12: qty 0.4

## 2021-02-12 MED ORDER — DEXAMETHASONE SODIUM PHOSPHATE 10 MG/ML IJ SOLN
INTRAMUSCULAR | Status: DC | PRN
  Administered 2021-02-12: 10 mg via INTRAVENOUS

## 2021-02-12 MED ORDER — ROCURONIUM BROMIDE 10 MG/ML (PF) SYRINGE
PREFILLED_SYRINGE | INTRAVENOUS | Status: DC | PRN
  Administered 2021-02-12: 20 mg via INTRAVENOUS
  Administered 2021-02-12: 30 mg via INTRAVENOUS
  Administered 2021-02-12 (×3): 20 mg via INTRAVENOUS
  Administered 2021-02-12: 70 mg via INTRAVENOUS

## 2021-02-12 MED ORDER — DEXAMETHASONE SODIUM PHOSPHATE 10 MG/ML IJ SOLN
INTRAMUSCULAR | Status: AC
  Filled 2021-02-12: qty 1

## 2021-02-12 MED ORDER — PHENYLEPHRINE 40 MCG/ML (10ML) SYRINGE FOR IV PUSH (FOR BLOOD PRESSURE SUPPORT)
PREFILLED_SYRINGE | INTRAVENOUS | Status: AC
  Filled 2021-02-12: qty 20

## 2021-02-12 MED ORDER — BUPIVACAINE HCL (PF) 0.5 % IJ SOLN
INTRAMUSCULAR | Status: DC | PRN
  Administered 2021-02-12 (×2): 20 mL via PERINEURAL

## 2021-02-12 MED ORDER — CHLORHEXIDINE GLUCONATE CLOTH 2 % EX PADS: 6.0000 | MEDICATED_PAD | Freq: Once | CUTANEOUS | Status: DC

## 2021-02-12 MED ORDER — DIPHENHYDRAMINE HCL 50 MG/ML IJ SOLN
INTRAMUSCULAR | Status: DC | PRN
  Administered 2021-02-12: 6.25 mg via INTRAVENOUS

## 2021-02-12 MED ORDER — LACTATED RINGERS IV SOLN: INTRAVENOUS | Status: DC

## 2021-02-12 SURGICAL SUPPLY — 92 items
ADH SKN CLS APL DERMABOND .7 (GAUZE/BANDAGES/DRESSINGS) ×6
ALLOGRAFT PERF 16X20 1.6+/-0.4 (Tissue) ×2 IMPLANT
APL PRP STRL LF DISP 70% ISPRP (MISCELLANEOUS) ×6
APPLIER CLIP 9.375 MED OPEN (MISCELLANEOUS) ×4
APR CLP MED 9.3 20 MLT OPN (MISCELLANEOUS) ×3
BAG COUNTER SPONGE SURGICOUNT (BAG) ×8 IMPLANT
BAG DECANTER FOR FLEXI CONT (MISCELLANEOUS) ×4 IMPLANT
BAG SPNG CNTER NS LX DISP (BAG) ×6
BINDER BREAST LRG (GAUZE/BANDAGES/DRESSINGS) IMPLANT
BINDER BREAST XLRG (GAUZE/BANDAGES/DRESSINGS) IMPLANT
BINDER BREAST XXLRG (GAUZE/BANDAGES/DRESSINGS) ×1 IMPLANT
BLADE SURG 10 STRL SS (BLADE) IMPLANT
BNDG GAUZE ELAST 4 BULKY (GAUZE/BANDAGES/DRESSINGS) ×6 IMPLANT
CANISTER SUCT 3000ML PPV (MISCELLANEOUS) ×7 IMPLANT
CHLORAPREP W/TINT 26 (MISCELLANEOUS) ×8 IMPLANT
CLIP APPLIE 9.375 MED OPEN (MISCELLANEOUS) ×3 IMPLANT
CNTNR URN SCR LID CUP LEK RST (MISCELLANEOUS) ×3 IMPLANT
CONT SPEC 4OZ STRL OR WHT (MISCELLANEOUS) ×4
COVER MAYO STAND STRL (DRAPES) ×1 IMPLANT
COVER PROBE W GEL 5X96 (DRAPES) ×5 IMPLANT
COVER SURGICAL LIGHT HANDLE (MISCELLANEOUS) ×7 IMPLANT
DERMABOND ADVANCED (GAUZE/BANDAGES/DRESSINGS) ×2
DERMABOND ADVANCED .7 DNX12 (GAUZE/BANDAGES/DRESSINGS) ×6 IMPLANT
DRAIN CHANNEL 15F RND FF W/TCR (WOUND CARE) ×2 IMPLANT
DRAIN CHANNEL 19F RND (DRAIN) ×11 IMPLANT
DRAPE CHEST BREAST 15X10 FENES (DRAPES) ×3 IMPLANT
DRAPE HALF SHEET 40X57 (DRAPES) ×2 IMPLANT
DRAPE INCISE IOBAN 66X45 STRL (DRAPES) ×4 IMPLANT
DRAPE ORTHO SPLIT 77X108 STRL (DRAPES) ×12
DRAPE SURG ORHT 6 SPLT 77X108 (DRAPES) ×9 IMPLANT
DRAPE UTILITY XL STRL (DRAPES) ×3 IMPLANT
DRAPE WARM FLUID 44X44 (DRAPES) ×4 IMPLANT
DRSG PAD ABDOMINAL 8X10 ST (GAUZE/BANDAGES/DRESSINGS) ×15 IMPLANT
DRSG TEGADERM 4X10 (GAUZE/BANDAGES/DRESSINGS) ×6 IMPLANT
DRSG TEGADERM 4X4.75 (GAUZE/BANDAGES/DRESSINGS) ×17 IMPLANT
ELECT BLADE 4.0 EZ CLEAN MEGAD (MISCELLANEOUS)
ELECT CAUTERY BLADE 6.4 (BLADE) ×7 IMPLANT
ELECT COATED BLADE 2.86 ST (ELECTRODE) ×4 IMPLANT
ELECT REM PT RETURN 9FT ADLT (ELECTROSURGICAL) ×4
ELECTRODE BLDE 4.0 EZ CLN MEGD (MISCELLANEOUS) ×6 IMPLANT
ELECTRODE REM PT RTRN 9FT ADLT (ELECTROSURGICAL) ×6 IMPLANT
EVACUATOR SILICONE 100CC (DRAIN) ×7 IMPLANT
EXPANDER TISSUE FV FOURTE 500 (Prosthesis & Implant Plastic) IMPLANT
GAUZE SPONGE 4X4 12PLY STRL (GAUZE/BANDAGES/DRESSINGS) ×3 IMPLANT
GAUZE SPONGE 4X4 12PLY STRL LF (GAUZE/BANDAGES/DRESSINGS) ×3 IMPLANT
GLOVE SURG ENC MOIS LTX SZ6 (GLOVE) ×9 IMPLANT
GLOVE SURG ENC MOIS LTX SZ7 (GLOVE) ×4 IMPLANT
GLOVE SURG UNDER POLY LF SZ7.5 (GLOVE) ×4 IMPLANT
GOWN STRL REUS W/ TWL LRG LVL3 (GOWN DISPOSABLE) ×12 IMPLANT
GOWN STRL REUS W/TWL LRG LVL3 (GOWN DISPOSABLE) ×16
KIT BASIN OR (CUSTOM PROCEDURE TRAY) ×7 IMPLANT
KIT TURNOVER KIT B (KITS) ×7 IMPLANT
MARKER SKIN DUAL TIP RULER LAB (MISCELLANEOUS) ×4 IMPLANT
NDL 18GX1X1/2 (RX/OR ONLY) (NEEDLE) ×3 IMPLANT
NDL FILTER BLUNT 18X1 1/2 (NEEDLE) ×3 IMPLANT
NDL HYPO 25GX1X1/2 BEV (NEEDLE) ×3 IMPLANT
NEEDLE 18GX1X1/2 (RX/OR ONLY) (NEEDLE) IMPLANT
NEEDLE FILTER BLUNT 18X 1/2SAF (NEEDLE)
NEEDLE FILTER BLUNT 18X1 1/2 (NEEDLE) IMPLANT
NEEDLE HYPO 25GX1X1/2 BEV (NEEDLE) IMPLANT
NS IRRIG 1000ML POUR BTL (IV SOLUTION) ×11 IMPLANT
PACK GENERAL/GYN (CUSTOM PROCEDURE TRAY) ×7 IMPLANT
PAD ARMBOARD 7.5X6 YLW CONV (MISCELLANEOUS) ×8 IMPLANT
PENCIL SMOKE EVACUATOR (MISCELLANEOUS) ×4 IMPLANT
PIN SAFETY STERILE (MISCELLANEOUS) ×4 IMPLANT
SET ASEPTIC TRANSFER (MISCELLANEOUS) ×4 IMPLANT
SOL PREP POV-IOD 4OZ 10% (MISCELLANEOUS) ×4 IMPLANT
SPECIMEN JAR X LARGE (MISCELLANEOUS) ×3 IMPLANT
SPONGE T-LAP 18X18 ~~LOC~~+RFID (SPONGE) ×2 IMPLANT
STAPLER VISISTAT 35W (STAPLE) ×7 IMPLANT
SUT CHROMIC 4 0 PS 2 18 (SUTURE) ×6 IMPLANT
SUT ETHILON 2 0 FS 18 (SUTURE) ×8 IMPLANT
SUT MNCRL AB 4-0 PS2 18 (SUTURE) ×10 IMPLANT
SUT SILK 2 0 PERMA HAND 18 BK (SUTURE) ×4 IMPLANT
SUT VIC AB 0 CT2 27 (SUTURE) ×5 IMPLANT
SUT VIC AB 3-0 SH 18 (SUTURE) ×3 IMPLANT
SUT VIC AB 3-0 SH 27 (SUTURE) ×12
SUT VIC AB 3-0 SH 27X BRD (SUTURE) IMPLANT
SUT VIC AB 4-0 PS2 18 (SUTURE) ×2 IMPLANT
SUT VICRYL 4-0 PS2 18IN ABS (SUTURE) IMPLANT
SUT VLOC 180 0 24IN GS25 (SUTURE) ×2 IMPLANT
SYR 50ML LL SCALE MARK (SYRINGE) ×1 IMPLANT
SYR BULB IRRIG 60ML STRL (SYRINGE) ×4 IMPLANT
SYR CONTROL 10ML LL (SYRINGE) ×3 IMPLANT
TISSUE EXPNDR FV FOURTE 500 (Prosthesis & Implant Plastic) ×8 IMPLANT
TOWEL GREEN STERILE (TOWEL DISPOSABLE) ×7 IMPLANT
TOWEL GREEN STERILE FF (TOWEL DISPOSABLE) ×7 IMPLANT
TRAY FOLEY MTR SLVR 16FR STAT (SET/KITS/TRAYS/PACK) ×1 IMPLANT
TRAY FOLEY W/BAG SLVR 14FR LF (SET/KITS/TRAYS/PACK) IMPLANT
TUBE CONNECTING 12X1/4 (SUCTIONS) ×1 IMPLANT
UNDERPAD 30X36 HEAVY ABSORB (UNDERPADS AND DIAPERS) ×6 IMPLANT
YANKAUER SUCT BULB TIP NO VENT (SUCTIONS) ×2 IMPLANT

## 2021-02-12 NOTE — Interval H&P Note (Signed)
History and Physical Interval Note:  02/12/2021 10:01 AM  Renee [name]  has presented today for surgery, with the diagnosis of LEFT BREAST INVASIVE DUCTAL CARCINOMA.  The various methods of treatment have been discussed with the patient and family. After consideration of risks, benefits and other options for treatment, the patient has consented to  Procedure(s): LEFT MASTECTOMY WITH SENTINEL LYMPH NODE BIOPSY (Left) RIGHT PROPHYLACTIC MASTECTOMY (Right) BILATERAL BREAST RECONSTRUCTION WITH PLACEMENT OF TISSUE EXPANDER AND ALLODERM (Bilateral) as a surgical intervention.  The patient's history has been reviewed, patient examined, no change in status, stable for surgery.  I have reviewed the patient's chart and labs.  Questions were answered to the patient's satisfaction.     Arnoldo Hooker Eilidh Marcano

## 2021-02-12 NOTE — Progress Notes (Signed)
Patient resting quietly at this time with eyes closed

## 2021-02-12 NOTE — Op Note (Signed)
Pre-op diagnosis:  Invasive ductal carcinoma - left breast; family history of breast cancer Post-op diagnosis:  Same Procedure performed:  Left mastectomy with sentinel lymph node biopsy, risk-reducing right mastectomy (followed by immediate reconstruction) Surgeon:  Maia Petties Co-surgeon:  Dr. Irene Limbo Anesthesia:  Gen- LMA; Bilateral pectoralis blocks Indications:  This is a 50 year old female who presents after routine screening mammogram revealed a possible mass in the left breast.  Ultrasound was recommended.  On 12/25/2020 she underwent left breast ultrasound.  This detected a 3.7 x 3.1 x 3.1 cm mass located at 1:00, 2 cm from the nipple.  The axilla was unremarkable.  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  The patient has a family history of breast cancer in her mother.  Mammaprint - high risk; Genetics negative  After extensive discussion and consideration, she has decided on bilateral mastectomies with reconstruction, as well as sentinel lymph node biopsy.  She has deferred chemotherapy for now.  Description of procedure: The patient underwent bilateral pectoralis blocks by anesthesia in the preoperative area.  She was also injected with radioactive tracer by radiology.  I also injected magtrace in the retroareolar region in preop.  The patient was brought to the operating room and placed in the supine position on the operating room table.  After an adequate level of general anesthesia was obtained, a Foley catheter was placed under sterile technique.  The patient's chest bilateral axilla and upper arms were prepped with ChloraPrep and draped in sterile fashion.  A timeout was taken to ensure the proper patient and proper procedure.  We began on the patient's right side.  Dr. Iran Planas had outlined skin sparing mastectomy incisions.  I used a #10 blade to make the  incisions.  Cautery was used to dissect into the superficial breast tissue.  We carried our dissection medially to the edge of the sternum, superiorly to the infraclavicular chest wall, inferiorly to the inframammary fold, and laterally to the anterior edge of the latissimus muscle.  We then dissected the breast tissue off of the underlying pectoralis muscle from medial to lateral.  The specimen was oriented with a long suture lateral and a short suture superior.  Cautery was used for hemostasis.  We irrigated the wound thoroughly and then packed the wound with a sponge.  We then turned our attention to the left side.  I used a #10 blade to make the incision.  Cautery was used to dissect into the superficial breast tissue.  We carried our dissection medially to the edge of the sternum, superiorly to the infraclavicular chest wall, inferiorly to the inframammary fold, and laterally to the anterior edge of the latissimus muscle.  We then dissected the breast tissue off of the underlying pectoralis muscle from medial to lateral.  We dissected into the axilla.  We interrogated the axilla with the Magtrace probe.  We were able to identify an area of increased uptake.  We dissected into the axillary contents and identified a dark-colored lymph node with high counts.  We excised this as sentinel lymph node #1.  The lymph node had very high counts ex vivo.  We confirmed this with the neoprobe device which also yielded high counts ex vivo.  This lymph node was sent for pathologic examination.  There was minimal background activity.  We completed our mastectomy.  The specimen was oriented with a long suture lateral and a short suture  superior.  Cautery was used for hemostasis.  We irrigated the wound thoroughly and then packed the wound with a sponge.  I then turned the case over to Dr. Iran Planas for immediate reconstruction.  Imogene Burn. Georgette Dover, MD, [facility]  General Surgery   02/12/2021 4:41  PM

## 2021-02-12 NOTE — Interval H&P Note (Signed)
History and Physical Interval Note:  02/12/2021 10:47 AM  Renee [name]  has presented today for surgery, with the diagnosis of LEFT BREAST INVASIVE DUCTAL CARCINOMA.  The various methods of treatment have been discussed with the patient and family. After consideration of risks, benefits and other options for treatment, the patient has consented to  Procedure(s): LEFT MASTECTOMY WITH SENTINEL LYMPH NODE BIOPSY (Left) RIGHT PROPHYLACTIC MASTECTOMY (Right) BILATERAL BREAST RECONSTRUCTION WITH PLACEMENT OF TISSUE EXPANDER AND ALLODERM (Bilateral) as a surgical intervention.  The patient's history has been reviewed, patient examined, no change in status, stable for surgery.  I have reviewed the patient's chart and labs.  Questions were answered to the patient's satisfaction.     Renee [name]

## 2021-02-12 NOTE — Transfer of Care (Signed)
Immediate Anesthesia Transfer of Care Note  Patient: Renee [name]  Procedure(s) Performed: LEFT MASTECTOMY WITH SENTINEL LYMPH NODE BIOPSY (Left: Breast) RIGHT PROPHYLACTIC MASTECTOMY (Right: Breast) BILATERAL BREAST RECONSTRUCTION WITH PLACEMENT OF TISSUE EXPANDER AND ALLODERM (Bilateral: Breast)  Patient Location: PACU  Anesthesia Type:GA combined with regional for post-op pain  Level of Consciousness: awake, alert  and patient cooperative  Airway & Oxygen Therapy: Patient Spontanous Breathing  Post-op Assessment: Report given to RN, Post -op Vital signs reviewed and stable and Patient moving all extremities  Post vital signs: Reviewed and stable  Last Vitals:  Vitals Value Taken Time  BP 111/49 02/12/21 1705  Temp    Pulse 93 02/12/21 1707  Resp 12 02/12/21 1707  SpO2 95 % 02/12/21 1707  Vitals shown include unvalidated device data.  Last Pain:  Vitals:   02/12/21 0926  TempSrc:   PainSc: 0-No pain         Complications: No notable events documented.

## 2021-02-12 NOTE — Op Note (Signed)
Operative Note   DATE OF OPERATION: 9.8.22  LOCATION: [facility]-observation  SURGICAL DIVISION: Plastic Surgery  PREOPERATIVE DIAGNOSES:  Left breast cancer UOQ ER+  POSTOPERATIVE DIAGNOSES:  same  PROCEDURE:  1. Bilateral breast reconstruction with tissue expanders 2. Acellular dermis (Alloderm) to bilateral chest 600 cm2  SURGEON: Irene Limbo MD MBA  ASSISTANT: Gentry Fitz RNFA  ANESTHESIA:  General.   EBL: 75 ml for entire procedure  COMPLICATIONS: None immediate.   INDICATIONS FOR PROCEDURE:  The patient, Renee [name], is a 50 y.o. female born on 1971-01-30, is here for prepectoral tissue expander based reconstruction following bilateral skin reduction mastectomies.   FINDINGS: Natrelle 59 S FV -13-T 500 ml tissue expanders placed bilateral, initial fill volume 200 ml air bilateral. RIGHT SN [id number] LEFT SN [id number]  DESCRIPTION OF PROCEDURE:  The patient was marked with the patient in the preoperative area to mark sternal notch, chest midline, anterior axillary lines and inframammary folds. Patient was marked for skin reduction mastectomy with most superior portion nipple areola marked on breast meridian. Vertical limbs marked by breast displacement and set at 9 cm length. The patient was taken to the operating room. SCDs were placed and IV antibiotics were given. Foley catheter placed. The patient's operative site was prepped and draped in a sterile fashion. A time out was performed and all information was confirmed to be correct. In supine position, the lateral limbs for resection marked and area over lower pole preserved as inferiorly based dermal pedicle. Skin de epithelialized in this area. I assisted in mastectomies with retraction and exposure. Following completion of mastectomies, reconstruction began on right side.   The cavity was irrigated with solution containing Ancef, Betadine, and gentamicin. Hemostasis was ensured. A 19 Fr drain was placed in subcutaneous  position laterally and a 15 Fr drain placed along inframammary fold. Each secured to skin with 2-0 nylon. Cavity irrigated with Betadine. The tissue expanders were prepared on back table prior in insertion. The expander was filled with air. Perforated acellular dermis was  draped over anterior surface expander. The ADM was then secured to itself over posterior surface of expander. Redundant folds acellular dermis excised so that the ADM lied flat without folds over air filled expander. The expander was secured to medial insertion pectoralis with a 0 vicryl. The superior and lateral tabs also secured to pectoralis muscle with 0-vicryl. The ADM was secured to pectoralis muscle and chest wall along inferior border at inframammary fold. Laterally the mastectomy flap over posterior axillary line was advanced anteriorly and the subcutaneous tissue and superficial fascia was secured to pectoralis muscle and acellular dermis with 0-vicryl. The inferiorly based dermal pedicle was redraped superiorly over expander and acellular dermis and secured to pectoralis with interrupted 0-vicryl. Skin closure completed with 3-0 vicryl in fascial layer and 4-0 vicryl in dermis. Skin closure completed with 4-0 monocryl subcuticular and tissue adhesive.   I then directed my attention to left chest where similar irrigation and drain placement completed. The prepared expander with ADM secured over anterior surface was placed in right chest and tabs secured to chest wall and pectoralis muscle with 0- vicryl suture. The acellular dermis at inframammary fold was secured to chest wall with 0 V-lock suture. Laterally the mastectomy flap over posterior axillary line was advanced anteriorly and the subcutaneous tissue and superficial fascia was secured to pectoralis muscle and acellular dermis with 0-vicryl. The inferiorly based dermal pedicle was redraped superiorly over expander and acellular dermis and secured to pectoralis with  interrupted  0-vicryl. Skin closure completed with 3-0 vicryl in fascial layer and 4-0 vicryl in dermis. Skin closure completed with 4-0 monocryl subcuticular and tissue adhesive. Tegaderms applied bilateral, followed by dry dressing and breast binder.   The patient was allowed to wake from anesthesia, extubated and taken to the recovery room in satisfactory condition.   SPECIMENS: none  DRAINS: 15 and 19 Fr JP in right and left breast reconstruction

## 2021-02-12 NOTE — Anesthesia Preprocedure Evaluation (Addendum)
Anesthesia Evaluation  Patient identified by MRN, date of birth, ID band Patient awake    Reviewed: Allergy & Precautions, NPO status , Patient's Chart, lab work & pertinent test results  History of Anesthesia Complications (+) PONV  Airway Mallampati: II  TM Distance: >3 FB Neck ROM: Full    Dental no notable dental hx. (+) Teeth Intact, Dental Advisory Given   Pulmonary neg pulmonary ROS,    Pulmonary exam normal breath sounds clear to auscultation       Cardiovascular negative cardio ROS Normal cardiovascular exam Rhythm:Regular Rate:Normal     Neuro/Psych PSYCHIATRIC DISORDERS Anxiety Depression negative neurological ROS     GI/Hepatic negative GI ROS, Neg liver ROS,   Endo/Other  Hypothyroidism   Renal/GU negative Renal ROS  negative genitourinary   Musculoskeletal  (+) Arthritis ,   Abdominal   Peds  Hematology negative hematology ROS (+)   Anesthesia Other Findings Left breast CA  Reproductive/Obstetrics                            Anesthesia Physical Anesthesia Plan  ASA: 2  Anesthesia Plan: General and Regional   Post-op Pain Management:  Regional for Post-op pain   Induction: Intravenous  PONV Risk Score and Plan: 4 or greater and Midazolam, Dexamethasone, Ondansetron and Scopolamine patch - Pre-op  Airway Management Planned: Oral ETT  Additional Equipment:   Intra-op Plan:   Post-operative Plan: Extubation in OR  Informed Consent: I have reviewed the patients History and Physical, chart, labs and discussed the procedure including the risks, benefits and alternatives for the proposed anesthesia with the patient or authorized representative who has indicated his/her understanding and acceptance.     Dental advisory given  Plan Discussed with: CRNA  Anesthesia Plan Comments:        Anesthesia Quick Evaluation

## 2021-02-12 NOTE — Anesthesia Procedure Notes (Addendum)
Procedure Name: Intubation Date/Time: 02/12/2021 12:18 PM Performed by: Justin Mend, RN Pre-anesthesia Checklist: Patient identified, Emergency Drugs available, Suction available and Patient being monitored Patient Re-evaluated:Patient Re-evaluated prior to induction Oxygen Delivery Method: Circle System Utilized Preoxygenation: Pre-oxygenation with 100% oxygen Induction Type: IV induction Ventilation: Mask ventilation without difficulty Laryngoscope Size: Mac and 3 Grade View: Grade II Tube type: Oral Tube size: 7.0 mm Number of attempts: 1 Airway Equipment and Method: Stylet and Oral airway Placement Confirmation: ETT inserted through vocal cords under direct vision, positive ETCO2 and breath sounds checked- equal and bilateral Secured at: 23 cm Tube secured with: Tape Dental Injury: Teeth and Oropharynx as per pre-operative assessment  Comments: Intubation by Morton Peters

## 2021-02-13 ENCOUNTER — Encounter (HOSPITAL_COMMUNITY): Payer: Self-pay | Admitting: Surgery

## 2021-02-13 DIAGNOSIS — C50212 Malignant neoplasm of upper-inner quadrant of left female breast: Secondary | ICD-10-CM | POA: Diagnosis not present

## 2021-02-13 DIAGNOSIS — E039 Hypothyroidism, unspecified: Secondary | ICD-10-CM | POA: Diagnosis not present

## 2021-02-13 DIAGNOSIS — Z85850 Personal history of malignant neoplasm of thyroid: Secondary | ICD-10-CM | POA: Diagnosis not present

## 2021-02-13 DIAGNOSIS — Z170 Estrogen receptor positive status [ER+]: Secondary | ICD-10-CM | POA: Diagnosis not present

## 2021-02-13 LAB — BASIC METABOLIC PANEL
Anion gap: 8 (ref 5–15)
BUN: 6 mg/dL (ref 6–20)
CO2: 25 mmol/L (ref 22–32)
Calcium: 8.7 mg/dL — ABNORMAL LOW (ref 8.9–10.3)
Chloride: 102 mmol/L (ref 98–111)
Creatinine, Ser: 0.61 mg/dL (ref 0.44–1.00)
GFR, Estimated: >60 mL/min (ref >60)
Glucose, Bld: 134 mg/dL — ABNORMAL HIGH (ref 70–99)
Potassium: 3.7 mmol/L (ref 3.5–5.1)
Sodium: 135 mmol/L (ref 135–145)

## 2021-02-13 LAB — CBC
HCT: 33.4 % — ABNORMAL LOW (ref 36.0–46.0)
Hemoglobin: 11.1 g/dL — ABNORMAL LOW (ref 12.0–15.0)
MCH: 30.7 pg (ref 26.0–34.0)
MCHC: 33.2 g/dL (ref 30.0–36.0)
MCV: 92.5 fL (ref 80.0–100.0)
Platelets: 212 10*3/uL (ref 150–400)
RBC: 3.61 MIL/uL — ABNORMAL LOW (ref 3.87–5.11)
RDW: 11.9 % (ref 11.5–15.5)
WBC: 12.9 10*3/uL — ABNORMAL HIGH (ref 4.0–10.5)
nRBC: 0 % (ref 0.0–0.2)

## 2021-02-13 MED ORDER — ONDANSETRON 8 MG PO TBDP: 8.0000 mg | ORAL_TABLET | Freq: Three times a day (TID) | ORAL | 0 refills | Status: DC | PRN

## 2021-02-13 NOTE — Anesthesia Postprocedure Evaluation (Signed)
Anesthesia Post Note  Patient: Renee [name]  Procedure(s) Performed: LEFT MASTECTOMY WITH SENTINEL LYMPH NODE BIOPSY (Left: Breast) RIGHT PROPHYLACTIC MASTECTOMY (Right: Breast) BILATERAL BREAST RECONSTRUCTION WITH PLACEMENT OF TISSUE EXPANDER AND ALLODERM (Bilateral: Breast)     Patient location during evaluation: PACU Anesthesia Type: Regional and General Level of consciousness: awake and alert Pain management: pain level controlled Vital Signs Assessment: post-procedure vital signs reviewed and stable Respiratory status: spontaneous breathing, nonlabored ventilation, respiratory function stable and patient connected to nasal cannula oxygen Cardiovascular status: blood pressure returned to baseline and stable Postop Assessment: no apparent nausea or vomiting Anesthetic complications: no   No notable events documented.  Last Vitals:  Vitals:   02/13/21 0317 02/13/21 0745  BP: (!) 111/52 (!) 99/48  Pulse: 64 (!) 59  Resp:  16  Temp:  36.4 C  SpO2:  99%    Last Pain:  Vitals:   02/13/21 0901  TempSrc:   PainSc: 3                  Amair Shrout L Toshua Honsinger

## 2021-02-13 NOTE — Progress Notes (Signed)
Patient was transported via wheelchair by volunteer for discharge home; complaints of moderate pain on her incision site on bilateral chest; her incisions were clean, dry and intact; all 4 JP drains were unremarkable and draining well; room was checked and accounted for all her belongings; discharge instructions given to patient and her husband, including the emptying and recording of her drains, and they both verbalized understanding on the instructions given.

## 2021-02-13 NOTE — Discharge Summary (Signed)
Physician Discharge Summary  Patient ID: Renee [name] MRN: [id number] DOB/AGE: July 19, 1970 50 y.o.  Admit date: 02/12/2021 Discharge date: 02/13/2021  Admission Diagnoses: Left breast cancer  Discharge Diagnoses:  same  Discharged Condition: stable  Hospital Course: Post operatively patient had emesis. Nausea resolved and tolerated diet am POD#1. Pain controlled and patient ambulatory with minimal assist. Instructed on drain care.  Treatments: surgery: bilateral mastectomies left sentinel node bilateral breast reconstruction with tissue expanders acellular dermis 9.8.22  Discharge Exam: Blood pressure (!) 111/52, pulse 64, temperature 97.9 F (36.6 C), temperature source Oral, resp. rate 16, height 5' 8.5" (1.74 m), weight 89.4 kg, SpO2 98 %. Incision/Wound: chest soft bilateral incisions intact dry Tegaderms in place drains serosanguinous  Disposition: Discharge disposition: 01-Home or Self Care       Discharge Instructions     Call MD for:  redness, tenderness, or signs of infection (pain, swelling, bleeding, redness, odor or green/yellow discharge around incision site)   Complete by: As directed    Call MD for:  temperature >100.5   Complete by: As directed    Discharge instructions   Complete by: As directed    Ok to remove dressings and shower am 9.10.22. Soap and water ok, pat Tegaderms dry. Do not remove Tegaderms. No creams or ointments over incisions. Do not let drains dangle in shower, attach to lanyard or similar.Strip and record drains twice daily and bring log to clinic visit.  Breast binder or soft compression bra all other times.  Ok to raise arms above shoulders for bathing and dressing.  No house yard work or exercise until cleared by MD.   Recommend ibuprofen with meals to aid with pain control. Also ok to use Tylenol for pain. Recommend Miralax or Dulcolax as needed for constipation. Patient received all Rx preop.   Driving Restrictions   Complete  by: As directed    No driving for 2 weeks then no driving if taking prescription pain medication   Lifting restrictions   Complete by: As directed    No lifting > 5-10 lbs until cleared by MD   Resume previous diet   Complete by: As directed       Allergies as of 02/13/2021       Reactions   Erythromycin Other (See Comments)   Childhood- stomach ache        Medication List     TAKE these medications    Adderall XR 10 MG 24 hr capsule Generic drug: amphetamine-dextroamphetamine Take 10 mg by mouth every morning.   FLUoxetine 20 MG tablet Commonly known as: PROZAC Take 10 mg by mouth daily.   levothyroxine 125 MCG tablet Commonly known as: SYNTHROID Take 1 tablet by mouth See admin instructions. Only on Sundays   levothyroxine 137 MCG tablet Commonly known as: SYNTHROID Take 1 tablet by mouth See admin instructions. For 6 days a week Mon - Sat   MULTIVITAMIN PO Take 1 tablet by mouth daily.   Selenium 100 MCG Caps Take 100 mcg by mouth in the morning and at bedtime.   thiamine 100 MG tablet Commonly known as: Vitamin B-1 Take 100 mg by mouth daily.   vitamin C 1000 MG tablet Take 2,000 mg by mouth in the morning, at noon, and at bedtime.   VITAMIN E PO Take 500 Units by mouth daily.   Zinc 25 MG Tabs Take 25 mg by mouth daily.        Follow-up Information     Irene Limbo,  MD Follow up in 1 week(s).   Specialty: Plastic Surgery Why: as scheduled Contact information: [address] [place] [address] [phone]         Donnie Mesa, MD. Schedule an appointment as soon as possible for a visit in 2 week(s).   Specialty: General Surgery Contact information: [address] [place] [address] [phone]                 Signed: Irene Limbo 02/13/2021, 7:18 AM

## 2021-02-16 LAB — SURGICAL PATHOLOGY

## 2021-02-17 NOTE — Anesthesia Procedure Notes (Signed)
Anesthesia Regional Block: Pectoralis block   Pre-Anesthetic Checklist: , timeout performed,  Correct Patient, Correct Site, Correct Laterality,  Correct Procedure, Correct Position, site marked,  Risks and benefits discussed,  Surgical consent,  Pre-op evaluation,  At surgeon's request and post-op pain management  Laterality: Right  Prep: Maximum Sterile Barrier Precautions used, chloraprep       Needles:  Injection technique: Single-shot  Needle Type: Echogenic Stimulator Needle     Needle Length: 9cm  Needle Gauge: 22     Additional Needles:   Procedures:,,,, ultrasound used (permanent image in chart),,    Narrative:  Start time: 02/12/2021 11:22 AM End time: 02/12/2021 11:25 AM Injection made incrementally with aspirations every 5 mL.  Performed by: Personally  Anesthesiologist: Freddrick March, MD  Additional Notes: Monitors applied. No increased pain on injection. No increased resistance to injection. Injection made in 5cc increments. Good needle visualization. Patient tolerated procedure well.

## 2021-02-17 NOTE — Addendum Note (Signed)
Addendum  created 02/17/21 1829 by Freddrick March, MD   Child order released for a procedure order, Clinical Note Signed, Intraprocedure Blocks edited, SmartForm saved

## 2021-02-17 NOTE — Anesthesia Procedure Notes (Signed)
Anesthesia Regional Block: Pectoralis block   Pre-Anesthetic Checklist: , timeout performed,  Correct Patient, Correct Site, Correct Laterality,  Correct Procedure, Correct Position, site marked,  Risks and benefits discussed,  Surgical consent,  Pre-op evaluation,  At surgeon's request and post-op pain management  Laterality: Left  Prep: Maximum Sterile Barrier Precautions used, chloraprep       Needles:  Injection technique: Single-shot  Needle Type: Echogenic Stimulator Needle     Needle Length: 9cm  Needle Gauge: 22     Additional Needles:   Procedures:,,,, ultrasound used (permanent image in chart),,    Narrative:  Start time: 02/12/2021 11:20 AM End time: 02/12/2021 11:22 AM Injection made incrementally with aspirations every 5 mL.  Performed by: Personally  Anesthesiologist: Freddrick March, MD  Additional Notes: Monitors applied. No increased pain on injection. No increased resistance to injection. Injection made in 5cc increments. Good needle visualization. Patient tolerated procedure well.

## 2021-02-19 ENCOUNTER — Encounter: Payer: Self-pay | Admitting: *Deleted

## 2021-02-25 NOTE — Progress Notes (Addendum)
[place]  [phone] [phone]    ID: Renee [name] DOB: 1971/03/30  MR#: [id number]  GMW#:[id number]  Patient Care Team: Shon Baton, MD as PCP - General (Internal Medicine) Rockwell Germany, RN as Oncology Nurse Navigator Mauro Kaufmann, RN as Oncology Nurse Navigator Donnie Mesa, MD as Consulting Physician (General Surgery) Julia Alkhatib, Virgie Dad, MD as Consulting Physician (Oncology) Eppie Gibson, MD as Attending Physician (Radiation Oncology) Hollar, Katharine Look, MD as Referring Physician (Dermatology) Irene Limbo, MD as Consulting Physician (Plastic Surgery) Chauncey Cruel, MD OTHER MD:  I connected with Renee [name] on 02/26/21 at  9:00 AM EDT by video enabled telemedicine visit and verified that I am speaking with the correct person using two identifiers.   I discussed the limitations, risks, security and privacy concerns of performing an evaluation and management service by telemedicine and the availability of in-person appointments. I also discussed with the patient that there may be a patient responsible charge related to this service. The patient expressed understanding and agreed to proceed.   Other persons participating in the visit and their role in the encounter: None  Patient's location: Home Provider's location: [place]   I provided 30 minutes of face-to-face video visit time during this encounter, and > 50% was spent counseling as documented under my assessment & plan.   CHIEF COMPLAINT: Estrogen receptor positive breast cancer  CURRENT TREATMENT: Considering DIEP reconstruction   INTERVAL HISTORY: Renee [name] was contacted today for follow up of her estrogen receptor positive breast cancer.    Since her last visit here we obtained her genetics results, which did not show any deleterious mutations.   She opted to proceed with bilateral mastectomies on 02/12/2021 under Dr. Georgette Dover.  Pathology from the procedure ([id number]) showed: 1. Right Breast (prophylactic)  - benign breast parenchyma with mild fibrocystic change, including usual ductal hyperplasia and apocrine metaplasia  2. Left Breast  - invasive ductal carcinoma with extracellular mucin, 4.2 cm, grade 2  - carcinoma very focally involves anterior-superior margin  3. Left Axillary Lymph Node (1)  - negative for carcinoma (0/1)   REVIEW OF SYSTEMS: Renee [name] is recovering remarkably well from the surgery.  She had nausea initially for a day or 2 but that completely resolved.  For the pain she used the narcotics judiciously and, and they did cause some constipation which she was able to deal with.  She is now relying principally on nonsteroidals with good pain control.  She is actually having a little bit more difficulty on the right side which was a noncancer site and she is going to send me some photos which I hope to be able to incorporate into her epic note today, but in any case making a very good recovery, with the drains already out and looking forward to getting to physical therapy next week and back to work in about 2 weeks.     COVID 19 VACCINATION STATUS: refuses vaccination, infection 07/2020   HISTORY OF CURRENT ILLNESS: From the original intake note:  Renee [name] ("sir'noi-[id number]-vitch") had routine screening mammography on 12/05/2020 showing a possible abnormality in the left breast. She underwent left breast ultrasonography at [facility] on 12/25/2020 showing: suspicious 3.7 cm mass in left breast at 1 o'clock; no evidence of left axillary lymphadenopathy.  Accordingly on 01/05/2021 she proceeded to biopsy of the left breast area in question. The pathology from this procedure ([id number]) showed: invasive ductal carcinoma, grade 2, with extracellular mucin.  Prognostic indicators significant for: estrogen receptor, 95% positive and progesterone receptor, 90% positive, both with strong staining  intensity. Proliferation marker Ki67 at 20%. HER2 equivocal by immunohistochemistry (2+), but negative by fluorescent in situ hybridization with a signals ratio 1.26 and number per cell 2.15.  Cancer Staging Malignant neoplasm of upper-outer quadrant of left breast in female, estrogen receptor positive ([place]) Staging form: Breast, AJCC 8th Edition - Clinical: Stage IB (cT2, cN0, cM0, G2, ER+, PR+, HER2-) - Signed by Chauncey Cruel, MD on 01/14/2021 Histologic grading system: 3 grade system  The patient has a history of thyroid cancer status post total thyroidectomy and lymph node sampling (with positive lymph nodes) 2013 in [place].  She will bring the pathology report for further review at a different time  The patient's subsequent history is as detailed below.   PAST MEDICAL HISTORY: Past Medical History:  Diagnosis Date   Anxiety    Arthritis    hands and hips   Breast cancer ([place])    Cancer ([place]) 2013 Thyroid   Depression    Endometrial polyp 01/14/2016   Family history of breast cancer 01/14/2021   Fibroid, uterine    History of kidney stones    History of thyroid cancer 01/14/2021   Infertility, female    PONV (postoperative nausea and vomiting) 2013   Thyroid cancer ([place]) 2013   Urinary incontinence   Her thyroid cancer was diagnosed in 2013 in [place], [place]. (She has a copy of her pathology report)   PAST SURGICAL HISTORY: Past Surgical History:  Procedure Laterality Date   BREAST RECONSTRUCTION WITH PLACEMENT OF TISSUE EXPANDER AND ALLODERM Bilateral 02/12/2021   Procedure: BILATERAL BREAST RECONSTRUCTION WITH PLACEMENT OF TISSUE EXPANDER AND ALLODERM;  Surgeon: Irene Limbo, MD;  Location: [facility];  Service: Plastics;  Laterality: Bilateral;   HYSTEROSCOPY WITH D & C  10/13/2017   polyp resection   MASTECTOMY W/ SENTINEL NODE BIOPSY Left 02/12/2021   Procedure: LEFT MASTECTOMY WITH SENTINEL LYMPH NODE BIOPSY;  Surgeon: Donnie Mesa, MD;  Location: [facility];   Service: General;  Laterality: Left;   SIMPLE MASTECTOMY WITH AXILLARY SENTINEL NODE BIOPSY Right 02/12/2021   Procedure: RIGHT PROPHYLACTIC MASTECTOMY;  Surgeon: Donnie Mesa, MD;  Location: [facility];  Service: General;  Laterality: Right;   TOTAL THYROIDECTOMY  2013   WISDOM TOOTH EXTRACTION      FAMILY HISTORY: Family History  Problem Relation Age of Onset   Breast cancer Mother 53       bilateral mastectomy   Cancer Mother    Vision loss Mother    Diabetes Father    Skin cancer Sister        basal cell carcinoma; head/face   Skin cancer Maternal Grandfather        nose; dx after 53   Breast cancer Other        maternal great aunt x2; dx before 63   Her father is 8 and mother age 88, as of 01/2021. Eulonda has one sister (and no brothers). Her mother, Noel Christmas. [place], was my patient for breast cancer.  The patient also reports breast cancer in a maternal great-aunt (mother [name]'s aunt).   GYNECOLOGIC HISTORY:  No LMP recorded. (Menstrual status: Other). Menarche: 50 years old Age at first live birth: 50 years old [place] P 1 LMP 4+ years ago due to continuous birth control use Contraceptive: current use HRT n/a  Hysterectomy? no BSO? 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 Husband Sharla Kidney is vice Radio producer in [employer].. She lives at home with husband Sharla Kidney and their son Almyra Brace, age 40. She attends [employer]    ADVANCED DIRECTIVES: In the absence of any documentation to the contrary, the patient's spouse is their HCPOA.    HEALTH MAINTENANCE: Social History   Tobacco Use   Smoking status: Never   Smokeless tobacco: Never  Vaping Use   Vaping Use: Never used  Substance Use Topics   Alcohol use: Yes    Alcohol/week: 1.0 - 2.0 standard drink    Types: 1 Glasses of wine per week    Comment: rare   Drug use: No     Colonoscopy: never done  PAP: 10/2019,  negative  Bone density: n/a (age)   Allergies  Allergen Reactions   Erythromycin Other (See Comments)    Childhood- stomach ache    Current Outpatient Medications  Medication Sig Dispense Refill   ADDERALL XR 10 MG 24 hr capsule Take 10 mg by mouth every morning.  0   Ascorbic Acid (VITAMIN C) 1000 MG tablet Take 2,000 mg by mouth in the morning, at noon, and at bedtime.     FLUoxetine (PROZAC) 20 MG tablet Take 10 mg by mouth daily.     levothyroxine (SYNTHROID, LEVOTHROID) 125 MCG tablet Take 1 tablet by mouth See admin instructions. Only on Sundays  3   levothyroxine (SYNTHROID, LEVOTHROID) 137 MCG tablet Take 1 tablet by mouth See admin instructions. For 6 days a week Mon - Sat  5   Multiple Vitamin (MULTIVITAMIN PO) Take 1 tablet by mouth daily.     ondansetron (ZOFRAN ODT) 8 MG disintegrating tablet Take 1 tablet (8 mg total) by mouth every 8 (eight) hours as needed for nausea or vomiting. 20 tablet 0   Selenium 100 MCG CAPS Take 100 mcg by mouth in the morning and at bedtime.     thiamine (VITAMIN B-1) 100 MG tablet Take 100 mg by mouth daily.     VITAMIN E PO Take 500 Units by mouth daily.     Zinc 25 MG TABS Take 25 mg by mouth daily.     No current facility-administered medications for this visit.    OBJECTIVE: White woman who appears well  There were no vitals filed for this visit.     There is no height or weight on file to calculate BMI.   Wt Readings from Last 3 Encounters:  02/12/21 197 lb (89.4 kg)  02/10/21 200 lb 8 oz (90.9 kg)  01/30/21 194 lb 3.2 oz (88.1 kg)     ECOG FS:1 - Symptomatic but completely ambulatory  Telemedicine visit 02/26/2021     LAB RESULTS:  CMP     Component Value Date/Time   NA 135 02/13/2021 0316   K 3.7 02/13/2021 0316   CL 102 02/13/2021 0316   CO2 25 02/13/2021 0316   GLUCOSE 134 (H) 02/13/2021 0316   BUN 6 02/13/2021 0316   CREATININE 0.61 02/13/2021 0316   CREATININE 0.78 01/14/2021 0816   CALCIUM 8.7 (L) 02/13/2021  0316   PROT 7.0 01/14/2021 0816   ALBUMIN 3.6 01/14/2021 0816   AST 16 01/14/2021 0816   ALT 21 01/14/2021 0816   ALKPHOS 72 01/14/2021 0816   BILITOT 0.4 01/14/2021 0816   GFRNONAA >60 02/13/2021 0316   GFRNONAA >60 01/14/2021 0816    No results found for: TOTALPROTELP, ALBUMINELP, A1GS, A2GS, BETS,  BETA2SER, GAMS, MSPIKE, SPEI  Lab Results  Component Value Date   WBC 12.9 (H) 02/13/2021   NEUTROABS 5.7 01/14/2021   HGB 11.1 (L) 02/13/2021   HCT 33.4 (L) 02/13/2021   MCV 92.5 02/13/2021   PLT 212 02/13/2021    No results found for: LABCA2  No components found for: [id number]  No results for input(s): INR in the last 168 hours.  No results found for: LABCA2  No results found for: WUJ811  No results found for: BJY782  No results found for: NFA213  No results found for: CA2729  No components found for: HGQUANT  No results found for: CEA1 / No results found for: CEA1   No results found for: AFPTUMOR  No results found for: CHROMOGRNA  No results found for: KPAFRELGTCHN, LAMBDASER, KAPLAMBRATIO (kappa/lambda light chains)  No results found for: HGBA, HGBA2QUANT, HGBFQUANT, HGBSQUAN (Hemoglobinopathy evaluation)   No results found for: LDH  No results found for: IRON, TIBC, IRONPCTSAT (Iron and TIBC)  No results found for: FERRITIN  Urinalysis No results found for: COLORURINE, APPEARANCEUR, LABSPEC, PHURINE, GLUCOSEU, HGBUR, BILIRUBINUR, KETONESUR, PROTEINUR, UROBILINOGEN, NITRITE, LEUKOCYTESUR   STUDIES: NM Sentinel Node Inj-No Rpt (Breast)  Result Date: 02/12/2021 Sulfur Colloid was injected by the Nuclear Medicine Technologist for sentinel lymph node localization.     ELIGIBLE FOR AVAILABLE RESEARCH PROTOCOL: no  ASSESSMENT: 50 y.o. Renee [name] woman status post left breast upper outer quadrant biopsy 01/05/2021 for a clinical T2 N0, stage IB invasive ductal carcinoma, grade 2, with mucinous features, estrogen and progesterone receptor positive, HER2  not amplified, with an MIB-1 of 20%  (1) genetics testing 01/20/2021 through the [facility] Panel found no deleterious mutations in ATM, BRCA1, BRCA2, CDH1, CHEK2, PALB2, PTEN, and TP53.  Results of pan-cancer panel are pending.   (2) MammaPrint obtained from the original biopsy shows a luminal B subtype, classified as high risk, suggesting a greater than 12% chemotherapy benefit.  The 5-year metastasis free survival with antiestrogen plus chemotherapy is predicted to be 93%  (3) status post bilateral mastectomies and left sentinel lymph node sampling 02/12/2021, showing  (A) on the right, no evidence for malignancy  (B) on the left, a pT2 pN0, stage IB invasive ductal carcinoma, grade 2, with a very focally positive anterior superior margin   (1) margin was discussed at conference on 02/25/2021; this cannot be located and it is "very focal"; this will be addressed through adjuvant radiation   (2) a single left axillary lymph node was removed  (4) adjuvant chemotherapy recommended  (5) consider adjuvant radiation given a focally positive margin  (6) antiestrogens recommended to start at the completion of local treatment  (7) considering DIEP reconstruction  (8) as of 02/26/2021 patient opts against adjuvant systemic treatment with chemotherapy or antiestrogens   PLAN: Renee [name]'s pathology was discussed in detail at the breast cancer multidisciplinary conference yesterday 02/25/2021.  It is clear that the positive margin is "very focally" positive, meaning it is not broad, meaning the possibility of there being some cancer left on the other side of the margin is small but not 0.  For that reason adjuvant radiation was suggested.  Renee [name] understandably is not eager to undergo this but she is willing to get more information on it and is agreeable to meeting with Dr. Isidore [name] to discuss.  She is particularly concerned about the fact that she is considering breast reconstruction and how  radiation may affect the reconstruction.  My concern of course is that we are dealing  with a high risk tumor and that the MammaPrint predicts a 12% decrease in risk of systemic recurrence.  Renee [name] is quite well-informed and knows that she may have microscopic disease in her liver lungs and bones today and that this is not something that we can rule out with scans.  In stage I cases like hers scans are invariably negative which does not mean the disease is not there only that it is microscopic. If it is there and not treated, it will eventually manifest itself as stage IV (incurable) disease.  She wanted to know numbers and I quoted her a 44% risk of developing stage IV breast cancer--namely of developing symptomatic breast cancer in her liver lungs or bones within the next 10 years--in the absence of any systemic treatment.  If she took antiestrogens for 5 years that risk would drop to about 20% and if she took chemotherapy in addition to antiestrogens that risk would drop to about 8%.  These are the numbers that I suggest she uses as she continues to consider whether or not she wants to receive standard adjuvant systemic treatment  As of today though she tells me she is making a lifelong commitment to improve her diet and exercise and that she feels very confident working with her alternative counselor from [place] who is providing her with supplements. She feels certain that she will not want either antiestrogens or chemotherapy.  As stated I am setting her up to see Dr. Isidore [name] in the next 2 weeks.  She will return to see me 04/06/2021 after her visit with Dr. France Ravens at [facility] and we will make a final determination regarding long-term plans at that time  Total encounter time 25 minutes.Renee [name] C. Romulo Okray, MD 02/26/2021 9:32 AM Medical Oncology and Hematology [facility] [place], [place] [address] Tel. [phone]    Fax. [phone]   This document  serves as a record of services personally performed by Lurline Del, MD. It was created on his behalf by Wilburn Mylar, a trained medical scribe. The creation of this record is based on the scribe's personal observations and the provider's statements to them.   I, Lurline Del MD, have reviewed the above documentation for accuracy and completeness, and I agree with the above.   *Total Encounter Time as defined by the Centers for Medicare and Medicaid Services includes, in addition to the face-to-face time of a patient visit (documented in the note above) non-face-to-face time: obtaining and reviewing outside history, ordering and reviewing medications, tests or procedures, care coordination (communications with other health care professionals or caregivers) and documentation in the medical record.

## 2021-02-26 ENCOUNTER — Inpatient Hospital Stay: Payer: BC Managed Care – PPO | Attending: Oncology | Admitting: Oncology

## 2021-02-26 DIAGNOSIS — Z170 Estrogen receptor positive status [ER+]: Secondary | ICD-10-CM | POA: Insufficient documentation

## 2021-02-26 DIAGNOSIS — C73 Malignant neoplasm of thyroid gland: Secondary | ICD-10-CM

## 2021-02-26 DIAGNOSIS — C50412 Malignant neoplasm of upper-outer quadrant of left female breast: Secondary | ICD-10-CM | POA: Diagnosis not present

## 2021-02-26 DIAGNOSIS — Z9013 Acquired absence of bilateral breasts and nipples: Secondary | ICD-10-CM | POA: Insufficient documentation

## 2021-02-26 DIAGNOSIS — Z79899 Other long term (current) drug therapy: Secondary | ICD-10-CM | POA: Insufficient documentation

## 2021-02-26 DIAGNOSIS — Z85850 Personal history of malignant neoplasm of thyroid: Secondary | ICD-10-CM | POA: Insufficient documentation

## 2021-02-26 DIAGNOSIS — C50911 Malignant neoplasm of unspecified site of right female breast: Secondary | ICD-10-CM

## 2021-02-27 NOTE — Progress Notes (Signed)
Location of Breast Cancer:  Malignant neoplasm of upper-outer quadrant of LEFT breast, estrogen receptor positive  Histology per Pathology Report:  02/12/2021 FINAL MICROSCOPIC DIAGNOSIS:  A. BREAST, RIGHT, MASTECTOMY:  - Benign breast parenchyma with mild fibrocystic change, including usual ductal hyperplasia and apocrine metaplasia  - Negative for carcinoma  B. BREAST, LEFT, MASTECTOMY:  - Invasive ductal carcinoma with extracellular mucin, 4.2 cm, grade 2  - Carcinoma very focally involves the anterior-superior margin and focally 0.2 cm from the anterior-inferior margin  - Biopsy site changes  - See oncology table   C. LYMPH NODE, LEFT AXILLARY #1, SENTINEL, EXCISION:  - Lymph node, negative for carcinoma (0/1)  Receptor Status: ER(95%), PR (90%), Her2-neu (Negative via FISH), Ki-67(20%)  Did patient present with symptoms (if so, please note symptoms) or was this found on screening mammography?:  Patient had routine screening mammography on 12/05/2020 showing a possible abnormality in the left breast. She underwent left breast ultrasonography at [facility] on 12/25/2020 showing: suspicious 3.7 cm mass in left breast at 1 o'clock; no evidence of left axillary lymphadenopathy.  Past/Anticipated interventions by surgeon, if any:  02/12/2021 --Dr. Donnie Mesa Left mastectomy with sentinel lymph node biopsy Risk-reducing right mastectomy (followed by immediate reconstruction) --Dr. Irene Limbo Bilateral breast reconstruction with tissue expanders Acellular dermis (Alloderm) to bilateral chest 600 cm2  Past/Anticipated interventions by medical oncology, if any:  Under care of Dr. Lurline Del 02/26/2021 [place]'s pathology was discussed in detail at the breast cancer multidisciplinary conference yesterday 02/25/2021.  It is clear that the positive margin is "very focally" positive, meaning it is not broad, meaning the possibility of there being some cancer left on the  other side of the margin is small but not 0.   For that reason adjuvant radiation was suggested.  [place] understandably is not eager to undergo this but she is willing to get more information on it and is agreeable to meeting with Dr. Isidore Moos to discuss. She is particularly concerned about the fact that she is considering breast reconstruction and how radiation may affect the reconstruction. As of today though she tells me she is making a lifelong commitment to improve her diet and exercise and that she feels very confident working with her alternative counselor from [place] who is providing her with supplements.  She feels certain that she will not want either antiestrogens or chemotherapy. As stated I am setting her up to see Dr. Isidore Moos in the next 2 weeks.   She will return to see me 04/06/2021 after her visit with Dr. France Ravens at [facility] and we will make a final determination regarding long-term plans at that time  Lymphedema issues, if any:  Patient denies--believes she will be referred to PT after her next follow-up with her plastic surgeon    Pain issues, if any:  Reports soreness and mild pain to surgical sites. States her main concern currently is the stiffness/difficulty with range of motion to both her arms/shoulders   SAFETY ISSUES: Prior radiation? No Pacemaker/ICD? No Possible current pregnancy? No--LMP 4+ years due continuous birth control use Is the patient on methotrexate? No  Current Complaints / other details:  Nothing else of note

## 2021-03-01 NOTE — Progress Notes (Signed)
Radiation Oncology         [phone] ________________________________  Name: Renee [name] MRN: [id number]  Date: 03/02/2021  DOB: [birth year]  Follow-Up Visit Note  Outpatient  CC: Shon Baton, MD  Magrinat, Virgie Dad, MD  Diagnosis:      ICD-10-CM   1. Malignant neoplasm of upper-outer quadrant of left breast in female, estrogen receptor positive ([place])  C50.412    Z17.0      Stage IB (cT2, cN0, cM0) Left Breast UOQ, Invasive Ductal Carcinoma, ER+ / PR+ / Her2-, Grade 2 pT2, pN0   CHIEF COMPLAINT: Here to discuss management of left breast cancer  Narrative:  The patient returns today for follow-up.  She is with her mother-in-law.   Since breast clinic consultation date on 01/14/21, genetic testing collected on 01/14/21 was negative; revealing no clinically significant variants detected. MammaPrint testing also performed revealed the patient as high risk.   The patient opted to proceed with bilateral breast mastectomy on 02/12/21 which revealed: left breast tumor size of 4.2 cm; histology of left breast invasive ductal carcinoma with extracellular mucin; margin status to invasive disease: very focally involving the anterior-superior margin, and focally 0.2 cm from the anterior-inferior margin; nodal status of 1 left axillary sentinel lymph node negative for carcinoma;  ER status: 95%, positive; PR status 90% positive, both with strong staining intensity; Her2 status negative; Ki67 20%; Grade 2.  Right mastectomy specimen was negative  She has declined chemotherapy despite her MammaPrint score.  She plans to decline antiestrogen therapy as well.  She has adjusted her diet to a plant-based diet and is receiving homeopathic advice from a provider in [place].  She states that she feels very good about her plan and while she believes that she still has cancer cells in her body she is confident that her course of action is the right one.  She has undergone bilateral breast  reconstruction with bilateral expanders placed.  She is not yet sure if the expansion has been completed by plastic surgery.  She continues to follow with Dr. Iran Planas.  She plans to see Dr. Morley Kos at [facility] on October 21 to discuss alternative forms of plastic surgery that do not involve implants.  Symptomatically, the patient reports: Lymphedema issues, if any:  Patient denies--believes she will be referred to PT after her next follow-up with her plastic surgeon    Pain issues, if any:  Reports soreness and mild pain to surgical sites. States her main concern currently is the stiffness/difficulty with range of motion to both her arms/shoulders   SAFETY ISSUES: Prior radiation? No Pacemaker/ICD? No Possible current pregnancy? No--LMP 4+ years due continuous birth control use Is the patient on methotrexate? No        ALLERGIES:  is allergic to erythromycin.  Meds: Current Outpatient Medications  Medication Sig Dispense Refill   ADDERALL XR 10 MG 24 hr capsule Take 10 mg by mouth every morning.  0   Ascorbic Acid (VITAMIN C) 1000 MG tablet Take 2,000 mg by mouth in the morning, at noon, and at bedtime.     FLUoxetine (PROZAC) 20 MG tablet Take 10 mg by mouth daily.     levothyroxine (SYNTHROID, LEVOTHROID) 125 MCG tablet Take 1 tablet by mouth See admin instructions. Only on Sundays  3   levothyroxine (SYNTHROID, LEVOTHROID) 137 MCG tablet Take 1 tablet by mouth See admin instructions. For 6 days a week Mon - Sat  5   methocarbamol (ROBAXIN) 500 MG tablet Take  500 mg by mouth 4 (four) times daily.     Multiple Vitamin (MULTIVITAMIN PO) Take 1 tablet by mouth daily.     ondansetron (ZOFRAN ODT) 8 MG disintegrating tablet Take 1 tablet (8 mg total) by mouth every 8 (eight) hours as needed for nausea or vomiting. 20 tablet 0   Selenium 100 MCG CAPS Take 100 mcg by mouth in the morning and at bedtime.     thiamine (VITAMIN B-1) 100 MG tablet Take 100 mg by mouth daily.     VITAMIN E PO  Take 500 Units by mouth daily.     Zinc 25 MG TABS Take 25 mg by mouth daily.     No current facility-administered medications for this encounter.    Physical Findings:  weight is 193 lb 6 oz (87.7 kg). Her blood pressure is 110/44 (abnormal) and her pulse is 88. Her respiration is 17 and oxygen saturation is 100%. .     General: Alert and oriented, in no acute distress HEENT: Head is normocephalic.   Extremities: No cyanosis or edema. Musculoskeletal: Decent range of motion in shoulders bilaterally Neurologic: No obvious focalities. Speech is fluent.  Psychiatric: Judgment and insight are intact. Affect is appropriate. Breast exam reveals bilateral mastectomy scars are healing well.  Bilateral expanders in place.  Left partially reconstructed breast appears larger than right partially reconstructed breast.  Lab Findings: Lab Results  Component Value Date   WBC 12.9 (H) 02/13/2021   HGB 11.1 (L) 02/13/2021   HCT 33.4 (L) 02/13/2021   MCV 92.5 02/13/2021   PLT 212 02/13/2021      Radiographic Findings: NM Sentinel Node Inj-No Rpt (Breast)  Result Date: 02/12/2021 Sulfur Colloid was injected by the Nuclear Medicine Technologist for sentinel lymph node localization.    Impression/Plan: This is a very pleasant 50 year old woman with T2N0 ER positive left breast cancer and a focally positive margin.  The tumor was 4.2 cm in size.  We discussed adjuvant radiotherapy today.  I recommend that she receive radiation therapy to the left chest wall, 50.4 Gray in 28 fractions, without a boost, to reduce the risk of recurrence over the chest wall.  We discussed that comprehensive treatment to the supraclavicular and axillary nodes is more of a gray area but given her reluctance to be subjected to side effects, I think it would be entirely reasonable for [place] to only treat the left chest wall.  I acknowledge that her margin is focally positive and that her nodes were negative.  However, given her  age and lack of systemic therapy, I estimate that her long term risk of local relapse is at least 30% and this risk could be decreased to ~10% if she undergoes radiation therapy.  She is quite firm in her decision not to receive chemotherapy.  I strongly urged her to at least try antiestrogen therapy.  I explained that the antiestrogen therapy tends to have a less side effects than chemotherapy and still plays a significant role in preventing local relapses as well as distant relapses.  We discussed how this would also reduce her risk of breast cancer related deaths.  She understands that radiation therapy to the left chest wall has the main benefit of reducing local recurrences but it also carries a small reduction in her risk of a breast cancer related death.  I reviewed the logistics, benefits, risks, and potential side effects of radiation therapy in detail. Risks may include but not necessary be limited to acute and late  injury tissue in the radiation fields such as skin irritation (change in color/pigmentation, itching, dryness, pain, peeling). She may experience fatigue. We also discussed possible risk of long term cosmetic changes or scar tissue. There is also a smaller risk for lung toxicity, cardiac toxicity, brachial plexopathy, lymphedema, musculoskeletal changes, rib fragility or induction of a second malignancy, late chronic non-healing soft tissue wound.   We discussed that patients who undergo breast reconstruction and  radiation therapy are at higher risk of ultimately needing to have their implant removed compared to patients who only undergo breast reconstruction.  We discussed the technology that we use in our department to minimize risks of serious side effects.  She has a consultation at [facility] to discuss reconstructive options that are not available at [facility].  She does not want to make a decision about radiation until all of her plastic surgery consultations are complete.  I  sense that she is leaning against radiation therapy but I want to keep her options open. I will tentatively schedule her for a CT simulation around October 24.  She knows that she can cancel this if needed.   The patient asked good questions which I answered to her satisfaction.   A consent form has been signed and placed in her chart.  I wished her the very best and encouraged her to call if she has any questions in the interim.    On date of service, in total, I spent 50 minutes on this encounter. Patient was seen in person.  _____________________________________   Eppie Gibson, MD  This document serves as a record of services personally performed by Eppie Gibson, MD. It was created on her behalf by Roney Mans, a trained medical scribe. The creation of this record is based on the scribe's personal observations and the provider's statements to them. This document has been checked and approved by the attending provider.

## 2021-03-02 ENCOUNTER — Encounter: Payer: Self-pay | Admitting: Radiation Oncology

## 2021-03-02 ENCOUNTER — Other Ambulatory Visit: Payer: Self-pay

## 2021-03-02 ENCOUNTER — Ambulatory Visit
Admission: RE | Admit: 2021-03-02 | Discharge: 2021-03-02 | Disposition: A | Discharge status: Home / Self Care | Payer: BC Managed Care – PPO | Source: Ambulatory Visit | Attending: Radiation Oncology | Admitting: Radiation Oncology

## 2021-03-02 VITALS — BP 110/44 | HR 88 | Resp 17 | Wt 193.4 lb

## 2021-03-02 DIAGNOSIS — Z170 Estrogen receptor positive status [ER+]: Secondary | ICD-10-CM | POA: Diagnosis not present

## 2021-03-02 DIAGNOSIS — C50412 Malignant neoplasm of upper-outer quadrant of left female breast: Secondary | ICD-10-CM

## 2021-03-02 DIAGNOSIS — Z9013 Acquired absence of bilateral breasts and nipples: Secondary | ICD-10-CM | POA: Insufficient documentation

## 2021-03-02 DIAGNOSIS — Z79899 Other long term (current) drug therapy: Secondary | ICD-10-CM | POA: Insufficient documentation

## 2021-03-03 ENCOUNTER — Encounter: Payer: Self-pay | Admitting: *Deleted

## 2021-03-04 ENCOUNTER — Telehealth: Payer: Self-pay

## 2021-03-04 ENCOUNTER — Encounter: Payer: Self-pay | Admitting: *Deleted

## 2021-03-04 NOTE — Telephone Encounter (Signed)
Received update from [place] therapist that when contacted to schedule appointment, patient expressed desire to not pursue radiation. Dr. Isidore Moos and rest of patient's oncology team updated on patient's decision. Will continue to support as needed

## 2021-03-05 ENCOUNTER — Other Ambulatory Visit: Payer: Self-pay | Admitting: Oncology

## 2021-03-05 NOTE — Progress Notes (Signed)
Per email from Dr. Isidore Moos dated 03/05/2021, she will let us know that the patient has called and let them know that she will not proceed with radiation.  Radiation plans accordingly have been canceled.  The LOS from the visit a week ago requested an appointment with me 04/06/2021.  I have copied and we requested that just in case it was missed since I do not see that that has been scheduled.

## 2021-03-11 ENCOUNTER — Telehealth: Payer: Self-pay | Admitting: Oncology

## 2021-03-11 NOTE — Telephone Encounter (Signed)
Per sch msg, patient wants to r/s 10/31 appt. Called and left msg for patient to call back and r/s at her convenience

## 2021-03-16 ENCOUNTER — Inpatient Hospital Stay: Payer: BC Managed Care – PPO | Admitting: *Deleted

## 2021-03-17 ENCOUNTER — Other Ambulatory Visit: Payer: Self-pay

## 2021-03-17 ENCOUNTER — Ambulatory Visit: Payer: BC Managed Care – PPO | Attending: Plastic Surgery

## 2021-03-17 DIAGNOSIS — Z483 Aftercare following surgery for neoplasm: Secondary | ICD-10-CM | POA: Insufficient documentation

## 2021-03-17 DIAGNOSIS — C50412 Malignant neoplasm of upper-outer quadrant of left female breast: Secondary | ICD-10-CM | POA: Diagnosis not present

## 2021-03-17 DIAGNOSIS — Z170 Estrogen receptor positive status [ER+]: Secondary | ICD-10-CM | POA: Diagnosis not present

## 2021-03-17 DIAGNOSIS — R293 Abnormal posture: Secondary | ICD-10-CM | POA: Diagnosis not present

## 2021-03-17 DIAGNOSIS — M25612 Stiffness of left shoulder, not elsewhere classified: Secondary | ICD-10-CM | POA: Diagnosis not present

## 2021-03-17 DIAGNOSIS — M25611 Stiffness of right shoulder, not elsewhere classified: Secondary | ICD-10-CM | POA: Diagnosis not present

## 2021-03-17 NOTE — Therapy (Signed)
[facility] @ [place], [place], [address] Phone: [phone]   Fax:  [phone]  Physical Therapy Evaluation  Patient Details  Name: Renee [name] MRN: [id number] Date of Birth: Jun 03, 1971 Referring Provider (PT): Dr. Iran Planas   Encounter Date: 03/17/2021   PT End of Session - 03/17/21 1756     Visit Number 1    Number of Visits 12    Date for PT Re-Evaluation 04/28/21    PT Start Time 1502    PT Stop Time 9407    PT Time Calculation (min) 62 min    Activity Tolerance Patient tolerated treatment well    Behavior During Therapy [facility] for tasks assessed/performed             Past Medical History:  Diagnosis Date   Anxiety    Arthritis    hands and hips   Breast cancer ([place])    Cancer ([place]) 2013 Thyroid   Depression    Endometrial polyp 01/14/2016   Family history of breast cancer 01/14/2021   Fibroid, uterine    History of kidney stones    History of thyroid cancer 01/14/2021   Infertility, female    PONV (postoperative nausea and vomiting) 2013   Thyroid cancer ([place]) 2013   Urinary incontinence     Past Surgical History:  Procedure Laterality Date   BREAST RECONSTRUCTION WITH PLACEMENT OF TISSUE EXPANDER AND ALLODERM Bilateral 02/12/2021   Procedure: BILATERAL BREAST RECONSTRUCTION WITH PLACEMENT OF TISSUE EXPANDER AND ALLODERM;  Surgeon: Irene Limbo, MD;  Location: [place];  Service: Plastics;  Laterality: Bilateral;   HYSTEROSCOPY WITH D & C  10/13/2017   polyp resection   MASTECTOMY W/ SENTINEL NODE BIOPSY Left 02/12/2021   Procedure: LEFT MASTECTOMY WITH SENTINEL LYMPH NODE BIOPSY;  Surgeon: Donnie Mesa, MD;  Location: [place];  Service: General;  Laterality: Left;   SIMPLE MASTECTOMY WITH AXILLARY SENTINEL NODE BIOPSY Right 02/12/2021   Procedure: RIGHT PROPHYLACTIC MASTECTOMY;  Surgeon: Donnie Mesa, MD;  Location: [facility];  Service: General;  Laterality: Right;   TOTAL  THYROIDECTOMY  2013   WISDOM TOOTH EXTRACTION      There were no vitals filed for this visit.    Subjective Assessment - 03/17/21 1746     Subjective Pt had bilateral mastectomies with left SLNB on 02/12/2021 and immediate tissue expanders.  She is still trying to decide between implants and Diep Flap surgery and is going for another opinion. she has been doing some exercises at home.  She has been using hydrogen peroxide on tissue that is not quite healed under bilateral breasts. Discussed using aquaphor only and pictures to be sent to MD    Pertinent History Left Breast  with invasive ductal carcinoma with extracellular mucin, 4.2 cm, grade 2 carcinoma very focally involves anterior-superior margin with Left Axillary Lymph Node (1)              - negative for carcinoma (0/1). Cancer was ER, PR+, HER 2 -, with Ki67 of 20%. bilateral mastectomies performed on 02/12/2021 with left SLNB with tissue expanders placed.  Trying to decide between implants and Diep Flap. Mmmaprint high risk. Has denied chemo, radiation, antiestrogens    Patient Stated Goals Assessment post surgery    Currently in Pain? No/denies                [facility] PT Assessment - 03/17/21 0001       Assessment   Medical  Diagnosis Left breast Cancer    Referring Provider (PT) Dr. Iran Planas    Onset Date/Surgical Date 02/12/21    Hand Dominance Right    Prior Therapy no      Precautions   Precaution Comments lymphedema precautions      Restrictions   Weight Bearing Restrictions No      Balance Screen   Has the patient fallen in the past 6 months No    Has the patient had a decrease in activity level because of a fear of falling?  No    Is the patient reluctant to leave their home because of a fear of falling?  No      Home Environment   Living Environment Private residence    Living Arrangements Spouse/significant other;Children    Available Help at Discharge Family      Prior Function   Level of Independence  Independent    Vocation Part time employment    Academic librarian    Leisure walk, yoga      Cognition   Overall Cognitive Status Within Functional Limits for tasks assessed      Observation/Other Assessments   Observations bilateral inferior breast with yellowish healing , right breast proximal incision with scab.   right implant shifted upwards. ? anterior axillary swelling on right.    Skin Integrity incisions mostly healed but each breast with yellowish area inferiorly(see photos in Media) and right breast with scab at proximal incision.      Sensation   Light Touch --   right side with greater numbness than left     Posture/Postural Control   Posture/Postural Control Postural limitations    Postural Limitations Rounded Shoulders;Forward head      AROM   Right/Left Shoulder Right;Left    Right Shoulder Extension 40 Degrees    Right Shoulder Flexion 140 Degrees    Right Shoulder ABduction 135 Degrees    Right Shoulder Internal Rotation 72 Degrees    Right Shoulder External Rotation 92 Degrees    Left Shoulder Extension 45 Degrees    Left Shoulder Flexion 157 Degrees    Left Shoulder ABduction 160 Degrees    Left Shoulder Internal Rotation 68 Degrees    Left Shoulder External Rotation 94 Degrees               LYMPHEDEMA/ONCOLOGY QUESTIONNAIRE - 03/17/21 0001       Type   Cancer Type invasive Ductal Carcinoma      Surgeries   Mastectomy Date 02/12/21    Sentinel Lymph Node Biopsy Date 02/12/21    Number Lymph Nodes Removed 1      Treatment   Active Chemotherapy Treatment No    Past Chemotherapy Treatment No    Active Radiation Treatment No    Past Radiation Treatment No    Current Hormone Treatment No    Past Hormone Therapy No      What other symptoms do you have   Are you Having Heaviness or Tightness Yes    Are you having pitting edema No    Is it Hard or Difficult finding clothes that fit No    Do you have infections No    Is  there Decreased scar mobility Yes    Stemmer Sign No      Right Upper Extremity Lymphedema   10 cm Proximal to Olecranon Process 27.5 cm    Olecranon Process 24.5 cm    10 cm Proximal to Ulnar Styloid Process 21.9 cm  Just Proximal to Ulnar Styloid Process 15.9 cm    At [facility] of 2nd Digit 6.1 cm      Left Upper Extremity Lymphedema   10 cm Proximal to Olecranon Process 26.8 cm    Olecranon Process 24 cm    10 cm Proximal to Ulnar Styloid Process 20.7 cm    Just Proximal to Ulnar Styloid Process 15.5 cm    At Base of 2nd Digit 6.1 cm                     Objective measurements completed on examination: See above findings.                PT Education - 03/17/21 1756     Education Details 4 post op exercises/ABC class    Person(s) Educated Patient    Methods Explanation;Demonstration;Handout    Comprehension Verbalized understanding;Returned demonstration                 PT Long Term Goals - 03/17/21 1809       PT LONG TERM GOAL #1   Title Pt will be independent and compliant with HEP to improve shoulder ROM and strength    Time 6    Period Weeks    Status New    Target Date 04/28/21      PT LONG TERM GOAL #2   Title Pt will be able to perform usual home activities except heavier activities such as lifting and heavier household chores without limitation    Time 6    Period Weeks    Status New    Target Date 04/28/21      PT LONG TERM GOAL #3   Title Pt will have bilateral shoulder ROM WNL to resume home activites    Time 6    Period Weeks    Status New    Target Date 04/28/21      PT LONG TERM GOAL #4   Title Pt will attend ABC class    Time 6    Period Weeks    Status New    Target Date 04/28/21                    Plan - 03/17/21 1757     Clinical Impression Statement Pt is s/p bilateral mastectomies with left SLNB on 02/12/2021 with tissue expanders placed.  She presents with limitations in shoulder ROM Right  greater than left.  She continues with several areas on breasts that are not fully healed and photos sent to MD. Advised pt to continue with aquaphor unless we hear differently from MD.  The right implant sits slightly higher and makes anterior to the axillary region look more swollen.  There was no significant difference in circumference of arms. she will benefit from skilled PT to address deficits and return to [facility]    Examination-Activity Limitations Reach Overhead;Lift    Stability/Clinical Decision Making Stable/Uncomplicated    Clinical Decision Making Low    Rehab Potential Excellent    PT Frequency 2x / week    PT Duration 6 weeks    PT Treatment/Interventions ADLs/Self Care Home Management;Therapeutic exercise;Manual techniques;Patient/family education;Manual lymph drainage;Passive range of motion;Joint Manipulations    PT Next Visit Plan Continue AAROM, try pulleys, STM prn to pecs/lats etc, assess wound areas on breasts, progress PROM, strength when ready    PT Home Exercise Plan 4 post op exercises    Recommended Other Services ABC class set up  Consulted and Agree with Plan of Care Patient             Patient will benefit from skilled therapeutic intervention in order to improve the following deficits and impairments:  Decreased activity tolerance, Decreased knowledge of precautions, Decreased range of motion, Decreased skin integrity, Decreased strength, Postural dysfunction, Pain, Impaired UE functional use  Visit Diagnosis: Malignant neoplasm of upper-outer quadrant of left breast in female, estrogen receptor positive ([facility])  Abnormal posture  Aftercare following surgery for neoplasm  Stiffness of left shoulder, not elsewhere classified  Stiffness of right shoulder, not elsewhere classified     Problem List Patient Active Problem List   Diagnosis Date Noted   Invasive ductal carcinoma of breast, female, right ([place]) 02/12/2021   Post-operative nausea and  vomiting 02/12/2021   Genetic testing 01/20/2021   History of thyroid cancer 01/14/2021   Family history of breast cancer 01/14/2021   Malignant neoplasm of upper-outer quadrant of left breast in female, estrogen receptor positive ([place]) 01/08/2021   Attention deficit disorder (ADD) in adult 08/30/2016   Post-surgical hypothyroidism 08/30/2016   Thyroid cancer ([place]) 01/14/2016   Hypothyroidism 01/14/2016    Claris Pong, PT 03/17/2021, 6:19 PM  [facility] @ [place], [place], [address] Phone: [phone]   Fax:  [phone]  Name: ARY RUDNICK MRN: [id number] Date of Birth: February 01, 1971

## 2021-03-17 NOTE — Patient Instructions (Signed)
Patient was instructed today in a home exercise program today for post op shoulder range of motion. These included active assist shoulder flexion in supine, scapular retraction, wall walking with shoulder abduction, and hands behind head external rotation in supine.  She was encouraged to do these twice a day, holding 3 seconds and repeating 5 times. Also discussed avoiding hydrogen peroxide on inferior breast wound area and using aquaphor. Pictures sent to MD

## 2021-03-31 ENCOUNTER — Ambulatory Visit (HOSPITAL_BASED_OUTPATIENT_CLINIC_OR_DEPARTMENT_OTHER): Payer: No Typology Code available for payment source | Admitting: Obstetrics & Gynecology

## 2021-04-06 ENCOUNTER — Ambulatory Visit: Payer: BC Managed Care – PPO | Admitting: Oncology

## 2021-04-06 DIAGNOSIS — D2261 Melanocytic nevi of right upper limb, including shoulder: Secondary | ICD-10-CM | POA: Diagnosis not present

## 2021-04-06 DIAGNOSIS — D225 Melanocytic nevi of trunk: Secondary | ICD-10-CM | POA: Diagnosis not present

## 2021-04-06 DIAGNOSIS — L820 Inflamed seborrheic keratosis: Secondary | ICD-10-CM | POA: Diagnosis not present

## 2021-04-06 DIAGNOSIS — L821 Other seborrheic keratosis: Secondary | ICD-10-CM | POA: Diagnosis not present

## 2021-04-06 DIAGNOSIS — D2262 Melanocytic nevi of left upper limb, including shoulder: Secondary | ICD-10-CM | POA: Diagnosis not present

## 2021-04-06 DIAGNOSIS — E039 Hypothyroidism, unspecified: Secondary | ICD-10-CM | POA: Diagnosis not present

## 2021-04-06 DIAGNOSIS — C50412 Malignant neoplasm of upper-outer quadrant of left female breast: Secondary | ICD-10-CM | POA: Diagnosis not present

## 2021-04-07 ENCOUNTER — Encounter: Payer: Self-pay | Admitting: *Deleted

## 2021-04-07 ENCOUNTER — Ambulatory Visit: Payer: BC Managed Care – PPO

## 2021-04-09 ENCOUNTER — Inpatient Hospital Stay: Payer: BC Managed Care – PPO | Attending: Oncology | Admitting: Oncology

## 2021-04-09 ENCOUNTER — Other Ambulatory Visit: Payer: Self-pay

## 2021-04-09 ENCOUNTER — Encounter: Payer: Self-pay | Admitting: *Deleted

## 2021-04-09 VITALS — BP 111/42 | HR 76 | Temp 97.9°F | Resp 18 | Ht 68.5 in | Wt 188.4 lb

## 2021-04-09 DIAGNOSIS — Z9012 Acquired absence of left breast and nipple: Secondary | ICD-10-CM | POA: Insufficient documentation

## 2021-04-09 DIAGNOSIS — C50412 Malignant neoplasm of upper-outer quadrant of left female breast: Secondary | ICD-10-CM | POA: Diagnosis not present

## 2021-04-09 DIAGNOSIS — Z170 Estrogen receptor positive status [ER+]: Secondary | ICD-10-CM | POA: Diagnosis not present

## 2021-04-09 NOTE — Progress Notes (Signed)
[place]  [phone] Fax:([phone]) [phone]    ID: Renee [name] DOB: 1971-05-11  MR#: [id number]  NOB#:[id number]  Patient Care Team: Renee Baton, MD as PCP - General (Internal Medicine) Renee Germany, RN as Oncology Nurse Navigator Renee Kaufmann, RN as Oncology Nurse Navigator Renee Mesa, MD as Consulting Physician (General Surgery) Renee [name], Renee Dad, MD as Consulting Physician (Oncology) Renee Gibson, MD as Attending Physician (Radiation Oncology) [name], Renee Look, MD as Referring Physician (Dermatology) Renee Limbo, MD as Consulting Physician (Plastic Surgery) Renee Cruel, MD OTHER MD:   CHIEF COMPLAINT: Estrogen receptor positive breast cancer  CURRENT TREATMENT: Considering tamoxifen   INTERVAL HISTORY: Renee [name] returns today for follow up of her estrogen receptor positive breast cancer.  She is currently under observation.  Initially she declined all adjuvant treatment but currently she is thinking possibly she might agree to antiestrogens.   REVIEW OF SYSTEMS: Renee [name] has met with plastics here and at [facility].  She considered DIEP reconstruction.  Currently she is fairly decided that she is going to have implant reconstruction sometime early in the coming year after she has had more time to "get over" the current situation as she puts it.   COVID 19 VACCINATION STATUS: refuses vaccination, infection 07/2020   HISTORY OF CURRENT ILLNESS: From the original intake note:  Renee [name] ("sir'noi-[id number]-vitch") had routine screening mammography on 12/05/2020 showing a possible abnormality in the left breast. She underwent left breast ultrasonography at [facility] on 12/25/2020 showing: suspicious 3.7 cm mass in left breast at 1 o'clock; no evidence of left axillary lymphadenopathy.  Accordingly on 01/05/2021 she proceeded to biopsy of the left breast area in question. The pathology from this procedure  ([id number]) showed: invasive ductal carcinoma, grade 2, with extracellular mucin. Prognostic indicators significant for: estrogen receptor, 95% positive and progesterone receptor, 90% positive, both with strong staining intensity. Proliferation marker Ki67 at 20%. HER2 equivocal by immunohistochemistry (2+), but negative by fluorescent in situ hybridization with a signals ratio 1.26 and number per cell 2.15.  Cancer Staging Malignant neoplasm of upper-outer quadrant of left breast in female, estrogen receptor positive (Renee [name]) Staging form: Breast, AJCC 8th Edition - Clinical: Stage IB (cT2, cN0, cM0, G2, ER+, PR+, HER2-) - Signed by Renee Cruel, MD on 01/14/2021 Histologic grading system: 3 grade system  The patient has a history of thyroid cancer status post total thyroidectomy and lymph node sampling (with positive lymph nodes) 2013 in [place].  She will bring the pathology report for further review at a different time  The patient's subsequent history is as detailed below.   PAST MEDICAL HISTORY: Past Medical History:  Diagnosis Date   Anxiety    Arthritis    hands and hips   Breast cancer ([place])    Cancer ([place]) 2013 Thyroid   Depression    Endometrial polyp 01/14/2016   Family history of breast cancer 01/14/2021   Fibroid, uterine    History of [name] stones    History of thyroid cancer 01/14/2021   Infertility, female    PONV (postoperative nausea and vomiting) 2013   Thyroid cancer ([place]) 2013   Urinary incontinence   Her thyroid cancer was diagnosed in 2013 in [place], [place]. (She has a copy of her pathology report)   PAST SURGICAL HISTORY: Past Surgical History:  Procedure Laterality Date   BREAST RECONSTRUCTION WITH PLACEMENT OF TISSUE EXPANDER AND ALLODERM Bilateral 02/12/2021   Procedure: BILATERAL BREAST RECONSTRUCTION WITH PLACEMENT OF TISSUE EXPANDER  AND ALLODERM;  Surgeon: Renee Limbo, MD;  Location: [facility];  Service: Plastics;  Laterality: Bilateral;    HYSTEROSCOPY WITH D & C  10/13/2017   polyp resection   MASTECTOMY W/ SENTINEL NODE BIOPSY Left 02/12/2021   Procedure: LEFT MASTECTOMY WITH SENTINEL LYMPH NODE BIOPSY;  Surgeon: Renee Mesa, MD;  Location: [facility];  Service: General;  Laterality: Left;   SIMPLE MASTECTOMY WITH AXILLARY SENTINEL NODE BIOPSY Right 02/12/2021   Procedure: RIGHT PROPHYLACTIC MASTECTOMY;  Surgeon: Renee Mesa, MD;  Location: [facility];  Service: General;  Laterality: Right;   TOTAL THYROIDECTOMY  2013   WISDOM TOOTH EXTRACTION      FAMILY HISTORY: Family History  Problem Relation Age of Onset   Breast cancer Mother 54       bilateral mastectomy   Cancer Mother    Vision loss Mother    Diabetes Father    Skin cancer Sister        basal cell carcinoma; head/face   Skin cancer Maternal Grandfather        nose; dx after 13   Breast cancer Other        maternal great aunt x2; dx before 75   Her father is 54 and mother age 21, as of 01/2021. Renee [name] has one sister (and no brothers). Her mother, Renee [name]. Renee [name], was my patient for breast cancer.  The patient also reports breast cancer in a maternal great-aunt (mother [name]'s aunt).   GYNECOLOGIC HISTORY:  No LMP recorded. (Menstrual status: Other). Menarche: 50 years old Age at first live birth: 50 years old [place] P 1 LMP 4+ years ago due to continuous birth control use Contraceptive: current use HRT n/a  Hysterectomy? no BSO? 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 Husband Renee [name] is vice Radio producer in [employer].. She lives at home with husband Renee [name] and their son Renee [name], age 16. She attends [employer]    ADVANCED DIRECTIVES: In the absence of any documentation to the contrary, the patient's spouse is their HCPOA.    HEALTH MAINTENANCE: Social History   Tobacco Use   Smoking status: Never   Smokeless tobacco: Never  Vaping Use   Vaping  Use: Never used  Substance Use Topics   Alcohol use: Yes    Alcohol/week: 1.0 - 2.0 standard drink    Types: 1 Glasses of wine per week    Comment: rare   Drug use: No     Colonoscopy: never done  PAP: 10/2019, negative  Bone density: n/a (age)   Allergies  Allergen Reactions   Erythromycin Other (See Comments)    Childhood- stomach ache    Current Outpatient Medications  Medication Sig Dispense Refill   ADDERALL XR 10 MG 24 hr capsule Take 10 mg by mouth every morning. (Patient not taking: Reported on 03/17/2021)  0   Ascorbic Acid (VITAMIN C) 1000 MG tablet Take 2,000 mg by mouth in the morning, at noon, and at bedtime.     FLUoxetine (PROZAC) 20 MG tablet Take 10 mg by mouth daily. (Patient not taking: Reported on 03/17/2021)     levothyroxine (SYNTHROID, LEVOTHROID) 125 MCG tablet Take 1 tablet by mouth See admin instructions. Only on Sundays  3   levothyroxine (SYNTHROID, LEVOTHROID) 137 MCG tablet Take 1 tablet by mouth See admin instructions. For 6 days a week Mon - Sat  5   methocarbamol (ROBAXIN) 500 MG  tablet Take 500 mg by mouth 4 (four) times daily.     Multiple Vitamin (MULTIVITAMIN PO) Take 1 tablet by mouth daily.     ondansetron (ZOFRAN ODT) 8 MG disintegrating tablet Take 1 tablet (8 mg total) by mouth every 8 (eight) hours as needed for nausea or vomiting. (Patient not taking: Reported on 03/17/2021) 20 tablet 0   Selenium 100 MCG CAPS Take 100 mcg by mouth in the morning and at bedtime.     thiamine (VITAMIN B-1) 100 MG tablet Take 100 mg by mouth daily.     VITAMIN E PO Take 500 Units by mouth daily.     Zinc 25 MG TABS Take 25 mg by mouth daily.     No current facility-administered medications for this visit.    OBJECTIVE: White woman in no acute distress  Vitals:   04/09/21 1137  BP: (!) 111/42  Pulse: 76  Resp: 18  Temp: 97.9 F (36.6 C)  SpO2: 100%       Body mass index is 28.23 kg/m.   Wt Readings from Last 3 Encounters:  04/09/21 188 lb 6.4  oz (85.5 kg)  03/02/21 193 lb 6 oz (87.7 kg)  02/12/21 197 lb (89.4 kg)     ECOG FS:1 - Symptomatic but completely ambulatory  Sclerae unicteric, EOMs intact Wearing a mask No cervical or supraclavicular adenopathy Lungs no rales or rhonchi Heart regular rate and rhythm Abd soft, nontender, positive bowel sounds MSK no focal spinal tenderness, no upper extremity lymphedema Neuro: nonfocal, well oriented, appropriate affect Breasts: Status post bilateral mastectomies with expanders in place.  There is no evidence of local recurrence.  The cosmetic result is very good.  Both axillae are benign.  LAB RESULTS:  CMP     Component Value Date/Time   NA 135 02/13/2021 0316   K 3.7 02/13/2021 0316   CL 102 02/13/2021 0316   CO2 25 02/13/2021 0316   GLUCOSE 134 (H) 02/13/2021 0316   BUN 6 02/13/2021 0316   CREATININE 0.61 02/13/2021 0316   CREATININE 0.78 01/14/2021 0816   CALCIUM 8.7 (L) 02/13/2021 0316   PROT 7.0 01/14/2021 0816   ALBUMIN 3.6 01/14/2021 0816   AST 16 01/14/2021 0816   ALT 21 01/14/2021 0816   ALKPHOS 72 01/14/2021 0816   BILITOT 0.4 01/14/2021 0816   GFRNONAA >60 02/13/2021 0316   GFRNONAA >60 01/14/2021 0816    No results found for: TOTALPROTELP, ALBUMINELP, A1GS, A2GS, BETS, BETA2SER, GAMS, MSPIKE, SPEI  Lab Results  Component Value Date   WBC 12.9 (H) 02/13/2021   NEUTROABS 5.7 01/14/2021   HGB 11.1 (L) 02/13/2021   HCT 33.4 (L) 02/13/2021   MCV 92.5 02/13/2021   PLT 212 02/13/2021    No results found for: LABCA2  No components found for: [id number]  No results for input(s): INR in the last 168 hours.  No results found for: LABCA2  No results found for: HKV425  No results found for: ZDG387  No results found for: FIE332  No results found for: CA2729  No components found for: HGQUANT  No results found for: CEA1 / No results found for: CEA1   No results found for: AFPTUMOR  No results found for: CHROMOGRNA  No results found for:  KPAFRELGTCHN, LAMBDASER, KAPLAMBRATIO (kappa/lambda light chains)  No results found for: HGBA, HGBA2QUANT, HGBFQUANT, HGBSQUAN (Hemoglobinopathy evaluation)   No results found for: LDH  No results found for: IRON, TIBC, IRONPCTSAT (Iron and TIBC)  No results found for: FERRITIN  Urinalysis No results found for: COLORURINE, APPEARANCEUR, LABSPEC, PHURINE, GLUCOSEU, HGBUR, BILIRUBINUR, KETONESUR, PROTEINUR, UROBILINOGEN, NITRITE, LEUKOCYTESUR   STUDIES: No results found.   ELIGIBLE FOR AVAILABLE RESEARCH PROTOCOL: no  ASSESSMENT: 50 y.o. Renee [name] woman status post left breast upper outer quadrant biopsy 01/05/2021 for a clinical T2 N0, stage IB invasive ductal carcinoma, grade 2, with mucinous features, estrogen and progesterone receptor positive, HER2 not amplified, with an MIB-1 of 20%  (1) genetics testing 01/20/2021 through the [facility] Panel found no deleterious mutations in ATM, BRCA1, BRCA2, CDH1, CHEK2, PALB2, PTEN, and TP53.  Results of pan-cancer panel are pending.   (2) MammaPrint obtained from the original biopsy shows a luminal B subtype, classified as high risk, suggesting a greater than 12% chemotherapy benefit.  The 5-year metastasis free survival with antiestrogen plus chemotherapy is predicted to be 93%  (3) status post bilateral mastectomies and left sentinel lymph node sampling 02/12/2021, showing  (A) on the right, no evidence for malignancy  (B) on the left, a pT2 pN0, stage IB invasive ductal carcinoma, grade 2, with a very focally positive anterior superior margin   (1) margin was discussed at conference on 02/25/2021; this cannot be located and it is "very focal"; this would have to be addressed through adjuvant radiation   (2) a single left axillary lymph node was removed  (C) definitive reconstruction pending  (4) adjuvant chemotherapy recommended  (5) adjuvant radiation recommended given a focally positive margin  (6) antiestrogens recommended  to start at the completion of local treatment  (7) as of 02/26/2021 patient opts against adjuvant systemic treatment with chemotherapy or antiestrogens  PLAN: [place] continues to do a lot of reading and studying and is working with her Iiridologist who suggested a book using soy products to alleviate menopausal symptoms and to decrease the risk of breast cancer.  I do think soy products can relieve some menopausal symptoms since they contain plant estrogens.  However I do not think plan estrogens would prevent breast cancer and I would want to see some data on breast cancer patients, women who had a diagnosis of breast cancer and took these products and had fewer recurrences as compared to other women who also had breast cancer and did not take the products, before I would recommend this approach.  In short we discussed the way randomized controlled double blinded studies are done and why they are so powerful and so crucial in deciding the standard of care for someone like her.  At this point Renee [name] is not entirely opposed to considering tamoxifen but is not ready to start it.  She would like to complete her reconstruction and then come back and see [place] in about 6 months to continue that discussion.  She tells me she appreciates not being pressured and receiving information.  Hopefully she will go through the reconstruction without any major setbacks and she will return to see [place] in 6 months for further discussion.  Total encounter time 25 minutes.Sarajane Jews C. Kortland Nichols, MD 04/09/2021 4:58 PM Medical Oncology and Hematology [facility] [place],  [address] Tel. [phone]    Fax. [phone]   This document serves as a record of services personally performed by Lurline Del, MD. It was created on his behalf by Wilburn Mylar, a trained medical scribe. The creation of this record is based on the scribe's personal observations and the provider's  statements to them.   Lindie Spruce MD, have reviewed the above  documentation for accuracy and completeness, and I agree with the above.   *Total Encounter Time as defined by the Centers for Medicare and Medicaid Services includes, in addition to the face-to-face time of a patient visit (documented in the note above) non-face-to-face time: obtaining and reviewing outside history, ordering and reviewing medications, tests or procedures, care coordination (communications with other health care professionals or caregivers) and documentation in the medical record.

## 2021-04-10 ENCOUNTER — Ambulatory Visit: Payer: BC Managed Care – PPO | Attending: Plastic Surgery | Admitting: Physical Therapy

## 2021-04-10 DIAGNOSIS — Z170 Estrogen receptor positive status [ER+]: Secondary | ICD-10-CM | POA: Insufficient documentation

## 2021-04-10 DIAGNOSIS — Z483 Aftercare following surgery for neoplasm: Secondary | ICD-10-CM | POA: Insufficient documentation

## 2021-04-10 DIAGNOSIS — M25612 Stiffness of left shoulder, not elsewhere classified: Secondary | ICD-10-CM | POA: Insufficient documentation

## 2021-04-10 DIAGNOSIS — R293 Abnormal posture: Secondary | ICD-10-CM | POA: Diagnosis not present

## 2021-04-10 DIAGNOSIS — C50412 Malignant neoplasm of upper-outer quadrant of left female breast: Secondary | ICD-10-CM | POA: Insufficient documentation

## 2021-04-10 DIAGNOSIS — M25611 Stiffness of right shoulder, not elsewhere classified: Secondary | ICD-10-CM | POA: Diagnosis not present

## 2021-04-10 NOTE — Therapy (Signed)
[facility] [place], [place], [address] Phone: [phone]   Fax:  [phone]  Physical Therapy Treatment  Patient Details  Name: Renee [name] MRN: [id number] Date of Birth: 1971-05-29 Referring Provider (PT): Dr. Iran Planas   Encounter Date: 04/10/2021   PT End of Session - 04/10/21 1201     Visit Number 2    Number of Visits 12    Date for PT Re-Evaluation 04/28/21    PT Start Time 1100    PT Stop Time 3762    PT Time Calculation (min) 45 min    Activity Tolerance Patient tolerated treatment well    Behavior During Therapy [facility] for tasks assessed/performed             Past Medical History:  Diagnosis Date   Anxiety    Arthritis    hands and hips   Breast cancer ([place])    Cancer ([place]) 2013 Thyroid   Depression    Endometrial polyp 01/14/2016   Family history of breast cancer 01/14/2021   Fibroid, uterine    History of kidney stones    History of thyroid cancer 01/14/2021   Infertility, female    PONV (postoperative nausea and vomiting) 2013   Thyroid cancer ([place]) 2013   Urinary incontinence     Past Surgical History:  Procedure Laterality Date   BREAST RECONSTRUCTION WITH PLACEMENT OF TISSUE EXPANDER AND ALLODERM Bilateral 02/12/2021   Procedure: BILATERAL BREAST RECONSTRUCTION WITH PLACEMENT OF TISSUE EXPANDER AND ALLODERM;  Surgeon: Irene Limbo, MD;  Location: [facility];  Service: Plastics;  Laterality: Bilateral;   HYSTEROSCOPY WITH D & C  10/13/2017   polyp resection   MASTECTOMY W/ SENTINEL NODE BIOPSY Left 02/12/2021   Procedure: LEFT MASTECTOMY WITH SENTINEL LYMPH NODE BIOPSY;  Surgeon: Donnie Mesa, MD;  Location: [facility];  Service: General;  Laterality: Left;   SIMPLE MASTECTOMY WITH AXILLARY SENTINEL NODE BIOPSY Right 02/12/2021   Procedure: RIGHT PROPHYLACTIC MASTECTOMY;  Surgeon: Donnie Mesa, MD;  Location: [facility];  Service: General;  Laterality: Right;   TOTAL  THYROIDECTOMY  2013   WISDOM TOOTH EXTRACTION      There were no vitals filed for this visit.   Subjective Assessment - 04/10/21 1101     Subjective Pt says she doing well.  She says that she is feeling better every day.  She got an adjustment from the chiropractor yesterday and that helped and she got a massage that helped too. She has started back to yoga, She still has some congetion in right lateral trunk, incisions are healing well    Pertinent History Left Breast  with invasive ductal carcinoma with extracellular mucin, 4.2 cm, grade 2 carcinoma very focally involves anterior-superior margin with Left Axillary Lymph Node (1)              - negative for carcinoma (0/1). Cancer was ER, PR+, HER 2 -, with Ki67 of 20%. bilateral mastectomies performed on 02/12/2021 with left SLNB with tissue expanders placed.  Trying to decide between implants and Diep Flap. Mmmaprint high risk. Has denied chemo, radiation, antiestrogens    Patient Stated Goals Assessment post surgery    Currently in Pain? No/denies                [facility] PT Assessment - 04/10/21 0001       AROM   Right Shoulder Flexion 165 Degrees    Right Shoulder ABduction 170 Degrees    Left  Shoulder Flexion 165 Degrees    Left Shoulder ABduction 170 Degrees                           [facility] Adult PT Treatment/Exercise - 04/10/21 0001       Exercises   Exercises Shoulder      Shoulder Exercises: Supine   Other Supine Exercises on blue foam roller with arms supports passive stretch to anterior shoulder, pelvic tilts, marches, dead bug, arms open and close, up and down in painfree range      Shoulder Exercises: Standing   Row Both;5 reps    Theraband Level (Shoulder Row) Level 2 (Red)      Manual Therapy   Manual Therapy Edema management;Manual Lymphatic Drainage (MLD)    Manual therapy comments gave pt a patch of small dotted foam on white foam to wear inside binder or compression bra at right lateral  trunk    Manual Lymphatic Drainage (MLD) briefly, in sidelying for posterior inteaxillary anastamosis, lateral trunk                     PT Education - 04/10/21 1201     Education Details standing theraband rows    Person(s) Educated Patient    Methods Explanation;Demonstration    Comprehension Verbalized understanding;Returned demonstration                 PT Long Term Goals - 04/10/21 1201       PT LONG TERM GOAL #1   Title Pt will be independent and compliant with HEP to improve shoulder ROM and strength    Time 6    Period Weeks    Status On-going      PT LONG TERM GOAL #2   Title Pt will be able to perform usual home activities except heavier activities such as lifting and heavier household chores without limitation    Time 6    Period Weeks    Status On-going      PT LONG TERM GOAL #3   Title Pt will have bilateral shoulder ROM WNL to resume home activites    Period Weeks    Status Achieved      PT LONG TERM GOAL #4   Title Pt will attend ABC class    Time 6    Period Weeks    Status On-going                   Plan - 04/10/21 1202     Clinical Impression Statement Pt is making signifciant gains in ROM and return to her normal acitivites.  She still has tendency toward rounded posture so added resistive rows today with theraband and passive stretching of foam roller with core work.  She also has congestion in right lateral trunk so tried manual lymph drainage to this area Tissue had palpable softening and pt felt some relief, Issued foam patch to wear at home avoiding any pressure on incisions.  Pt will benefit from full session of MLD next session with more time for self instruction.    Stability/Clinical Decision Making Stable/Uncomplicated    Rehab Potential Excellent    PT Frequency 1x / week   OK to decrease to one time a week   PT Duration 6 weeks    PT Treatment/Interventions ADLs/Self Care Home Management;Therapeutic  exercise;Manual techniques;Patient/family education;Manual lymph drainage;Passive range of motion;Joint Manipulations    PT Next Visit Plan instruct in  full MLD next session to congestion on right lateral chest. Teach self technique Continue AAROM, try pulleys, STM prn to pecs/lats etc, assess wound areas on breasts, progress PROM, strength when ready    PT Home Exercise Plan 4 post op exercises, standing theraband rown    Consulted and Agree with Plan of Care Patient             Patient will benefit from skilled therapeutic intervention in order to improve the following deficits and impairments:  Decreased activity tolerance, Decreased knowledge of precautions, Decreased range of motion, Decreased skin integrity, Decreased strength, Postural dysfunction, Pain, Impaired UE functional use  Visit Diagnosis: Malignant neoplasm of upper-outer quadrant of left breast in female, estrogen receptor positive (HCC)  Abnormal posture  Aftercare following surgery for neoplasm     Problem List Patient Active Problem List   Diagnosis Date Noted   Invasive ductal carcinoma of breast, female, right ([place]) 02/12/2021   Post-operative nausea and vomiting 02/12/2021   Genetic testing 01/20/2021   History of thyroid cancer 01/14/2021   Family history of breast cancer 01/14/2021   Malignant neoplasm of upper-outer quadrant of left breast in female, estrogen receptor positive ([place]) 01/08/2021   Attention deficit disorder (ADD) in adult 08/30/2016   Post-surgical hypothyroidism 08/30/2016   Thyroid cancer ([place]) 01/14/2016   Hypothyroidism 01/14/2016   Donato Heinz. Owens Shark PT  Norwood Levo, PT 04/10/2021, 12:07 PM  [facility] @ [place], [place], [address] Phone: [phone]   Fax:  [phone]  Name: AMITA ATAYDE MRN: [id number] Date of Birth: 1970/10/22

## 2021-04-20 ENCOUNTER — Inpatient Hospital Stay: Payer: BC Managed Care – PPO | Admitting: *Deleted

## 2021-04-20 ENCOUNTER — Other Ambulatory Visit: Payer: Self-pay

## 2021-04-20 ENCOUNTER — Encounter: Payer: Self-pay | Admitting: General Practice

## 2021-04-20 NOTE — Progress Notes (Signed)
[facility] Spiritual Care Note  Assisted Renee [name] with reviewing and completing her AD in [facility]. Notarized by [employer] and witnessed by two volunteers. Original to patient, copy to HIM for scanning into her chart.   [place], [place], [facility] Pager [phone] Voicemail [phone]

## 2021-04-21 ENCOUNTER — Encounter: Payer: Self-pay | Admitting: Rehabilitation

## 2021-04-21 ENCOUNTER — Ambulatory Visit: Payer: BC Managed Care – PPO | Admitting: Rehabilitation

## 2021-04-21 DIAGNOSIS — R293 Abnormal posture: Secondary | ICD-10-CM

## 2021-04-21 DIAGNOSIS — M25612 Stiffness of left shoulder, not elsewhere classified: Secondary | ICD-10-CM

## 2021-04-21 DIAGNOSIS — Z483 Aftercare following surgery for neoplasm: Secondary | ICD-10-CM

## 2021-04-21 DIAGNOSIS — M25611 Stiffness of right shoulder, not elsewhere classified: Secondary | ICD-10-CM | POA: Diagnosis not present

## 2021-04-21 DIAGNOSIS — C50412 Malignant neoplasm of upper-outer quadrant of left female breast: Secondary | ICD-10-CM | POA: Diagnosis not present

## 2021-04-21 DIAGNOSIS — Z170 Estrogen receptor positive status [ER+]: Secondary | ICD-10-CM | POA: Diagnosis not present

## 2021-04-21 NOTE — Therapy (Signed)
[facility] @ [place] [place], [place], [address] Phone: [phone]   Fax:  [phone]  Physical Therapy Treatment  Patient Details  Name: Renee [name] MRN: [id number] Date of Birth: 10-10-1970 Referring Provider (PT): Dr. Iran Planas   Encounter Date: 04/21/2021   PT End of Session - 04/21/21 1154     Visit Number 2    Number of Visits 12    Date for PT Re-Evaluation 04/28/21    PT Start Time 1100    PT Stop Time 1150    PT Time Calculation (min) 50 min    Activity Tolerance Patient tolerated treatment well    Behavior During Therapy [facility] for tasks assessed/performed             Past Medical History:  Diagnosis Date   Anxiety    Arthritis    hands and hips   Breast cancer ([place])    Cancer ([place]) 2013 Thyroid   Depression    Endometrial polyp 01/14/2016   Family history of breast cancer 01/14/2021   Fibroid, uterine    History of kidney stones    History of thyroid cancer 01/14/2021   Infertility, female    PONV (postoperative nausea and vomiting) 2013   Thyroid cancer ([place]) 2013   Urinary incontinence     Past Surgical History:  Procedure Laterality Date   BREAST RECONSTRUCTION WITH PLACEMENT OF TISSUE EXPANDER AND ALLODERM Bilateral 02/12/2021   Procedure: BILATERAL BREAST RECONSTRUCTION WITH PLACEMENT OF TISSUE EXPANDER AND ALLODERM;  Surgeon: Irene Limbo, MD;  Location: [facility];  Service: Plastics;  Laterality: Bilateral;   HYSTEROSCOPY WITH D & C  10/13/2017   polyp resection   MASTECTOMY W/ SENTINEL NODE BIOPSY Left 02/12/2021   Procedure: LEFT MASTECTOMY WITH SENTINEL LYMPH NODE BIOPSY;  Surgeon: Donnie Mesa, MD;  Location: [facility];  Service: General;  Laterality: Left;   SIMPLE MASTECTOMY WITH AXILLARY SENTINEL NODE BIOPSY Right 02/12/2021   Procedure: RIGHT PROPHYLACTIC MASTECTOMY;  Surgeon: Donnie Mesa, MD;  Location: [facility];  Service: General;  Laterality: Right;   TOTAL  THYROIDECTOMY  2013   WISDOM TOOTH EXTRACTION      There were no vitals filed for this visit.   Subjective Assessment - 04/21/21 1105     Subjective The Rt side is just weird.  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 Cancer was ER, PR+, HER 2 -, with Ki67 of 20%. bilateral mastectomies performed on 02/12/2021 with left SLNB with tissue expanders placed.  Trying to decide between implants and Diep Flap. Mmmaprint high risk. Has denied chemo, radiation, antiestrogens    Currently in Pain? No/denies                [facility] PT Assessment - 04/21/21 0001       Observation/Other Assessments   Observations Rt expander tighter and rippling evident anterior chest, Lt breast overall larger and softer looking expander      Palpation   Palpation comment not noted in supine but some lumpiness on the Lt anterior breast most likely expander but pt is going to let Dr. Iran Planas know as it is new  and close to the location of the primary tumor.                           [facility] Adult PT Treatment/Exercise - 04/21/21 0001       Manual Therapy   Manual Lymphatic Drainage (MLD) in supine: short neck, sternal nodes, axillary nodes, inguinal nodes, axillao inguinal and pathway to sternum and then breast and lateral trunk focus towards pathways and then repeated on the other side, then in sidelying bil to the lateral trunk                          PT Long Term Goals - 04/10/21 1201       PT LONG TERM GOAL #1   Title Pt will be independent and compliant with HEP to improve shoulder ROM and strength    Time 6    Period Weeks    Status On-going      PT LONG TERM GOAL #2   Title Pt will be able  to perform usual home activities except heavier activities such as lifting and heavier household chores without limitation    Time 6    Period Weeks    Status On-going      PT LONG TERM GOAL #3   Title Pt will have bilateral shoulder ROM WNL to resume home activites    Period Weeks    Status Achieved      PT LONG TERM GOAL #4   Title Pt will attend ABC class    Time 6    Period Weeks    Status On-going                   Plan - 04/21/21 1154     Clinical Impression Statement Pt still with expander discomfort bilaterally.  The Rt expander is much tighter leading to lateral trunk discomfort and slight puffiness skin vs edema.  The left breast and naterior chest seems a bit puffy today as well as into the axilla but pt reports that left breast was also larger.  MLD beneficial per pt upon leaving.    PT Frequency 1x / week    PT Duration 6 weeks    PT Treatment/Interventions ADLs/Self Care Home Management;Therapeutic exercise;Manual techniques;Patient/family education;Manual lymph drainage;Passive range of motion;Joint Manipulations    PT Next Visit Plan Continue AAROM, MLD bil lateral trunk/breast    Consulted and Agree with Plan of Care Patient             Patient will benefit from skilled therapeutic intervention in order to improve the following deficits and impairments:     Visit Diagnosis: Malignant neoplasm of upper-outer quadrant of left breast in female, estrogen receptor positive ([place])  Abnormal posture  Aftercare following surgery for neoplasm  Stiffness of left shoulder, not elsewhere classified  Stiffness of right shoulder, not elsewhere classified     Problem List Patient Active Problem List   Diagnosis Date Noted   Invasive ductal carcinoma of breast, female, right ([place]) 02/12/2021   Post-operative nausea and vomiting 02/12/2021   Genetic testing 01/20/2021   History of thyroid cancer 01/14/2021   Family history of breast cancer 01/14/2021    Malignant neoplasm of upper-outer quadrant of left breast in female, estrogen receptor positive ([place]) 01/08/2021   Attention deficit disorder (ADD) in adult 08/30/2016   Post-surgical hypothyroidism 08/30/2016   Thyroid cancer ([place]) 01/14/2016   Hypothyroidism 01/14/2016  Stark Bray, PT 04/21/2021, 11:57 AM  [facility] @ [address] [place], [place], [address] Phone: [phone]   Fax:  [phone]  Name: CASIDY ALBERTA MRN: [id number] Date of Birth: [birth year]

## 2021-04-27 ENCOUNTER — Ambulatory Visit: Payer: BC Managed Care – PPO

## 2021-04-27 ENCOUNTER — Other Ambulatory Visit: Payer: Self-pay

## 2021-04-27 DIAGNOSIS — Z170 Estrogen receptor positive status [ER+]: Secondary | ICD-10-CM | POA: Diagnosis not present

## 2021-04-27 DIAGNOSIS — Z483 Aftercare following surgery for neoplasm: Secondary | ICD-10-CM

## 2021-04-27 DIAGNOSIS — C50412 Malignant neoplasm of upper-outer quadrant of left female breast: Secondary | ICD-10-CM | POA: Diagnosis not present

## 2021-04-27 DIAGNOSIS — M25611 Stiffness of right shoulder, not elsewhere classified: Secondary | ICD-10-CM | POA: Diagnosis not present

## 2021-04-27 DIAGNOSIS — R293 Abnormal posture: Secondary | ICD-10-CM | POA: Diagnosis not present

## 2021-04-27 DIAGNOSIS — M25612 Stiffness of left shoulder, not elsewhere classified: Secondary | ICD-10-CM

## 2021-04-27 NOTE — Therapy (Signed)
[facility] @ [place] [place], [place], [address] Phone: [phone]   Fax:  [phone]  Physical Therapy Treatment  Patient Details  Name: Renee [name] MRN: [id number] Date of Birth: 1971/05/15 Referring Provider (PT): Dr. Iran Planas   Encounter Date: 04/27/2021   PT End of Session - 04/27/21 1159     Visit Number 3    Number of Visits 12    Date for PT Re-Evaluation 05/18/21    PT Start Time 1107    PT Stop Time 5176    PT Time Calculation (min) 49 min    Activity Tolerance Patient tolerated treatment well    Behavior During Therapy [facility] for tasks assessed/performed             Past Medical History:  Diagnosis Date   Anxiety    Arthritis    hands and hips   Breast cancer ([place])    Cancer ([place]) 2013 Thyroid   Depression    Endometrial polyp 01/14/2016   Family history of breast cancer 01/14/2021   Fibroid, uterine    History of kidney stones    History of thyroid cancer 01/14/2021   Infertility, female    PONV (postoperative nausea and vomiting) 2013   Thyroid cancer ([place]) 2013   Urinary incontinence     Past Surgical History:  Procedure Laterality Date   BREAST RECONSTRUCTION WITH PLACEMENT OF TISSUE EXPANDER AND ALLODERM Bilateral 02/12/2021   Procedure: BILATERAL BREAST RECONSTRUCTION WITH PLACEMENT OF TISSUE EXPANDER AND ALLODERM;  Surgeon: Irene Limbo, MD;  Location: [facility];  Service: Plastics;  Laterality: Bilateral;   HYSTEROSCOPY WITH D & C  10/13/2017   polyp resection   MASTECTOMY W/ SENTINEL NODE BIOPSY Left 02/12/2021   Procedure: LEFT MASTECTOMY WITH SENTINEL LYMPH NODE BIOPSY;  Surgeon: Donnie Mesa, MD;  Location: [facility];  Service: General;  Laterality: Left;   SIMPLE MASTECTOMY WITH AXILLARY SENTINEL NODE BIOPSY Right 02/12/2021   Procedure: RIGHT PROPHYLACTIC MASTECTOMY;  Surgeon: Donnie Mesa, MD;  Location: [facility];  Service: General;  Laterality: Right;   TOTAL  THYROIDECTOMY  2013   WISDOM TOOTH EXTRACTION      There were no vitals filed for this visit.   Subjective Assessment - 04/27/21 1107     Subjective Still feel like I am healing. Still feel swollen and have some discomfort at the lateral breast.  Shoulder ROM is doing well. I can do most things but use caution.  I am tired at the end of the day. I painted my livingroom and I was sore. Having surgery on January 3 with Saline implants. I still feel a little area in the left breast  that feels firm but it worries me.    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 Cancer was ER, PR+, HER 2 -, with Ki67 of 20%. bilateral mastectomies performed on 02/12/2021 with left SLNB with tissue expanders placed.  Trying to decide between implants and Diep Flap. Mmmaprint high risk. Has denied chemo, radiation, antiestrogens    Patient Stated Goals Assessment post surgery    Currently in Pain? No/denies   just tender to touch.  [facility] Adult PT Treatment/Exercise - 04/27/21 0001       Manual Therapy   Manual Lymphatic Drainage (MLD) in supine: short neck, 5 diaphragmatic breaths, sternal nodes, axillary nodes, inguinal nodes, axillo- inguinal and pathway to sternum and then breast and lateral trunk focus towards pathways and then repeated on the other side. PT performed and instructed and had pt perform all steps.                          PT Long Term Goals - 04/27/21 1116       PT LONG TERM GOAL #1   Title Pt will be independent and compliant with HEP to improve shoulder ROM and strength    Time 6    Period Weeks    Status Achieved    Target Date 04/27/21      PT LONG TERM GOAL #2   Title Pt will be able to perform usual home activities except  heavier activities such as lifting and heavier household chores without limitation    Time 6    Period Weeks    Status Achieved    Target Date 04/27/21      PT LONG TERM GOAL #3   Title Pt will have bilateral shoulder ROM WNL to resume home activites    Time 6    Period Weeks    Status Achieved    Target Date 04/27/21      PT LONG TERM GOAL #4   Title Pt will attend ABC class    Time 6    Period Weeks    Status Achieved      PT LONG TERM GOAL #5   Title Pt will be independent in Self MLD to breast and lateral trunks    Time 3    Period Weeks    Status New    Target Date 05/18/21                   Plan - 04/27/21 1204     Clinical Impression Statement Pt continues with lateral trunk discomfort/swelling bilaterally and with some puffiness noted at left upper breast.  Pt performed MLD and instructed pt. in same.  She used very good technique but requires VC's and TC's for proper stretch and sequence.  Modified written instructions to include sternal nodes.  Pt has achieved all other goals and may be released when she is comfortable with MLD. Pt advised to see MD about left breast area that is worrying her to put her mind at ease   Examination-Activity Limitations Reach Overhead;Lift    Stability/Clinical Decision Making Stable/Uncomplicated    Rehab Potential Excellent    PT Frequency 1x / week    PT Duration 3 weeks    PT Treatment/Interventions ADLs/Self Care Home Management;Therapeutic exercise;Manual techniques;Patient/family education;Manual lymph drainage;Passive range of motion;Joint Manipulations    PT Next Visit Plan Review MLD to bilateral trunk and breast with pt performing, release to HEP when independent    PT Home Exercise Plan 4 post op exercises, standing theraband rown    Consulted and Agree with Plan of Care Patient             Patient will benefit from skilled therapeutic intervention in order to improve the following deficits and impairments:   Decreased activity tolerance, Decreased knowledge of precautions, Decreased range of motion, Decreased skin integrity, Decreased strength, Postural dysfunction, Pain, Impaired UE functional use  Visit Diagnosis: Malignant neoplasm of upper-outer  quadrant of left breast in female, estrogen receptor positive ([place])  Abnormal posture  Aftercare following surgery for neoplasm  Stiffness of left shoulder, not elsewhere classified  Stiffness of right shoulder, not elsewhere classified     Problem List Patient Active Problem List   Diagnosis Date Noted   Invasive ductal carcinoma of breast, female, right ([place]) 02/12/2021   Post-operative nausea and vomiting 02/12/2021   Genetic testing 01/20/2021   History of thyroid cancer 01/14/2021   Family history of breast cancer 01/14/2021   Malignant neoplasm of upper-outer quadrant of left breast in female, estrogen receptor positive ([place]) 01/08/2021   Attention deficit disorder (ADD) in adult 08/30/2016   Post-surgical hypothyroidism 08/30/2016   Thyroid cancer ([place]) 01/14/2016   Hypothyroidism 01/14/2016    Claris Pong, PT 04/27/2021, 12:07 PM  [facility] @ [place], [place], [address] Phone: [phone]   Fax:  [phone]  Name: MARYMARGARET KIRKER MRN: [id number] Date of Birth: 29-Jul-1970

## 2021-05-05 ENCOUNTER — Other Ambulatory Visit: Payer: Self-pay

## 2021-05-05 ENCOUNTER — Ambulatory Visit: Payer: BC Managed Care – PPO | Admitting: Rehabilitation

## 2021-05-05 ENCOUNTER — Encounter: Payer: Self-pay | Admitting: Rehabilitation

## 2021-05-05 DIAGNOSIS — C50412 Malignant neoplasm of upper-outer quadrant of left female breast: Secondary | ICD-10-CM | POA: Diagnosis not present

## 2021-05-05 DIAGNOSIS — Z483 Aftercare following surgery for neoplasm: Secondary | ICD-10-CM | POA: Diagnosis not present

## 2021-05-05 DIAGNOSIS — M25612 Stiffness of left shoulder, not elsewhere classified: Secondary | ICD-10-CM

## 2021-05-05 DIAGNOSIS — M25611 Stiffness of right shoulder, not elsewhere classified: Secondary | ICD-10-CM

## 2021-05-05 DIAGNOSIS — Z170 Estrogen receptor positive status [ER+]: Secondary | ICD-10-CM

## 2021-05-05 DIAGNOSIS — R293 Abnormal posture: Secondary | ICD-10-CM

## 2021-05-05 NOTE — Therapy (Addendum)
[facility] @ [address] [place], [place], [address] Phone: [phone]   Fax:  [phone]  Physical Therapy Treatment  Patient Details  Name: Renee [name] MRN: [id number] Date of Birth: 08-19-1970 Referring Provider (PT): Dr. Iran Planas   Encounter Date: 05/05/2021   PT End of Session - 05/05/21 1150     Visit Number 4    Number of Visits 12    Date for PT Re-Evaluation 05/18/21    PT Start Time 1109    PT Stop Time 1150    PT Time Calculation (min) 41 min    Activity Tolerance Patient tolerated treatment well    Behavior During Therapy [facility] for tasks assessed/performed             Past Medical History:  Diagnosis Date   Anxiety    Arthritis    hands and hips   Breast cancer ([place])    Cancer ([place]) 2013 Thyroid   Depression    Endometrial polyp 01/14/2016   Family history of breast cancer 01/14/2021   Fibroid, uterine    History of kidney stones    History of thyroid cancer 01/14/2021   Infertility, female    PONV (postoperative nausea and vomiting) 2013   Thyroid cancer ([place]) 2013   Urinary incontinence     Past Surgical History:  Procedure Laterality Date   BREAST RECONSTRUCTION WITH PLACEMENT OF TISSUE EXPANDER AND ALLODERM Bilateral 02/12/2021   Procedure: BILATERAL BREAST RECONSTRUCTION WITH PLACEMENT OF TISSUE EXPANDER AND ALLODERM;  Surgeon: Irene Limbo, MD;  Location: [facility];  Service: Plastics;  Laterality: Bilateral;   HYSTEROSCOPY WITH D & C  10/13/2017   polyp resection   MASTECTOMY W/ SENTINEL NODE BIOPSY Left 02/12/2021   Procedure: LEFT MASTECTOMY WITH SENTINEL LYMPH NODE BIOPSY;  Surgeon: Donnie Mesa, MD;  Location: [facility];  Service: General;  Laterality: Left;   SIMPLE MASTECTOMY WITH AXILLARY SENTINEL NODE BIOPSY Right 02/12/2021   Procedure: RIGHT PROPHYLACTIC MASTECTOMY;  Surgeon: Donnie Mesa, MD;  Location: [facility];  Service: General;  Laterality: Right;   TOTAL  THYROIDECTOMY  2013   WISDOM TOOTH EXTRACTION      There were no vitals filed for this visit.   Subjective Assessment - 05/05/21 1104     Subjective I feel like I should be able to do this on my own.  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 Cancer was ER, PR+, HER 2 -, with Ki67 of 20%. bilateral mastectomies performed on 02/12/2021 with left SLNB with tissue expanders placed.  Trying to decide between implants and Diep Flap. Mmmaprint high risk. Has denied chemo, radiation, antiestrogens    Currently in Pain? No/denies                               [facility] Adult PT Treatment/Exercise - 05/05/21 0001       Manual Therapy   Manual Lymphatic Drainage (MLD) Review of self MLD in seated, then PT performed in supinein supine: short neck, 5 diaphragmatic breaths, sternal nodes, axillary nodes, inguinal nodes, axillo- inguinal and pathway to sternum and then breast and lateral trunk focus towards pathways and then repeated  on the other side.                          PT Long Term Goals - 05/05/21 1153       PT LONG TERM GOAL #1   Title Pt will be independent and compliant with HEP to improve shoulder ROM and strength    Status Achieved      PT LONG TERM GOAL #2   Title Pt will be able to perform usual home activities except heavier activities such as lifting and heavier household chores without limitation    Status Achieved      PT LONG TERM GOAL #3   Title Pt will have bilateral shoulder ROM WNL to resume home activites    Status Achieved      PT LONG TERM GOAL #4   Title Pt will attend ABC class    Status Achieved      PT LONG TERM GOAL #5   Title Pt will be independent in Self MLD to breast and lateral trunks    Status  Achieved                   Plan - 05/05/21 1151     Clinical Impression Statement Pt looks softer in the lateral chest region today on the Rt compared to last visit with this PT.  Pt feels ind with self MLD and is really just waiting for switch to implants.  Pt will return if having difficulty with self MLD    Consulted and Agree with Plan of Care Patient             Patient will benefit from skilled therapeutic intervention in order to improve the following deficits and impairments:     Visit Diagnosis: Malignant neoplasm of upper-outer quadrant of left breast in female, estrogen receptor positive ([place])  Abnormal posture  Aftercare following surgery for neoplasm  Stiffness of right shoulder, not elsewhere classified  Stiffness of left shoulder, not elsewhere classified     Problem List Patient Active Problem List   Diagnosis Date Noted   Invasive ductal carcinoma of breast, female, right ([place]) 02/12/2021   Post-operative nausea and vomiting 02/12/2021   Genetic testing 01/20/2021   History of thyroid cancer 01/14/2021   Family history of breast cancer 01/14/2021   Malignant neoplasm of upper-outer quadrant of left breast in female, estrogen receptor positive ([place]) 01/08/2021   Attention deficit disorder (ADD) in adult 08/30/2016   Post-surgical hypothyroidism 08/30/2016   Thyroid cancer ([place]) 01/14/2016   Hypothyroidism 01/14/2016    Stark Bray, PT 05/05/2021, 11:54 AM  [facility] @ [place], [place], [address] Phone: [phone]   Fax:  [phone]  Name: SAHALIE BETH MRN: [id number] Date of Birth: [birth year]   PHYSICAL THERAPY DISCHARGE SUMMARY  Visits from Start of Care: 4  Current functional level related to goals / functional outcomes: Ready for DC   Remaining deficits: See above   Education / Equipment: Final HEP  Plan: Patient agrees to discharge.   Patient goals were met.  Cert signed and episode resolved.

## 2021-05-25 DIAGNOSIS — Z9013 Acquired absence of bilateral breasts and nipples: Secondary | ICD-10-CM | POA: Diagnosis not present

## 2021-05-25 DIAGNOSIS — Z853 Personal history of malignant neoplasm of breast: Secondary | ICD-10-CM | POA: Diagnosis not present

## 2021-05-25 NOTE — H&P (Signed)
Subjective:     Patient ID: Renee [name] is a 50 y.o. female.   HPI   3 months post op bilateral mastectomies with tissue expander based reconstruction. Plan implant exchange next month. Since last visit reportrd palpable area left breast first noted 4 weeks ago.   Presented following screening MMG with possible left breast mass. US showed a 3.7 cm mass in left breast at 1 o'clock; left axilla normal. Biopsy demonstrated IDC with extracellular mucin, ER/PR+, Her2 equivocal by IHC, negative by FISH. Mammaprint high risk.    Patient elected for bilateral mastectomies. Final pathology 4.2 cm IDC left breast focally involves the anterior-superior margin. 0/1 SLN.   Patient declining chemotherapy and hormonal treatment. Patient offered PMRT, she declined this.   Mother and MA with breast ca. 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 Mother, who is retired Therapist, sports, underwent bilateral mastectomies with TE then silicone implant reconstruction with Drs. Streck and [employer].   Prior 56 D. Right mastectomy 1174 g Left mastectomy 1047 g   PMH significant for thyroid cancer status post total thyroidectomy and LN sampling (with positive lymph nodes) 2013 in [place].   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 Husband is VP of sales in the furniture business. Lives with spouse and teenage son.   Review of Systems      Objective:   Physical Exam Cardiovascular:     Rate and Rhythm: Regular rhythm.     Pulses: Normal pulses.     Heart sounds: Normal heart sounds.  Pulmonary:     Effort: Pulmonary effort is normal.     Breath sounds: Normal breath sounds.     Chest: Soft bilateral, bilateral chest expanded left chest larger volume than right unchanged No rippling noted      Assessment:     Left breast cancer UOQ ER+ S/p bilateral SRM, left SLN, bilateral prepectoral TE/ADM (Alloderm) reconstruction    Plan:     Pictures today. Area of  concern over left chest is port of expander. No other palpable masses.   Plan removal bilateral chest tissue expanders and placement implants. Discussed IMF incision alone.    Reviewed saline vs silicone, shaped v round. As in prepectoral position I recommend HCG or capacity filled silicone implants to reduce risk visible rippling. Reviewed MRI or [place] surveillance for rupture with silicone implants. Previously reviewed examples for 4th generation, capacity filled 4th generation, and HCG implants vs saline implants. Reviewed smooth vs textured and ALCL risk with latter, purpose of texturing. Patient notes she is happy with current volume. Reviewed shared risks rupture contracture need for additional surgery. Reviewed implants are not permanent devices. Patient asked which had less breast implant illness. Counseled there is no diagnostic criteria for this. Recent literature notes reported by patients with both saline and silicone implants. Patient has elected for saline, plan smooth round.   Discussed purpose fat grafting. Reviewed donor site pain compression variable take graft fat necrosis that presents as palpable lumps. Counseled liposuction to abdomen may prevent her from being DIEP candidate in future. As she also has no visible rippling presently, recommend she defer this for now. Patient in agreement.   Discussed OP surgery no drains anticipated.    Completed [facility] physician patient checklist for saline implants.   Rx for zofran, tramadol, robaxin and bactrim given. Hold tea regimen and Vit E week prior to surgery.

## 2021-05-27 ENCOUNTER — Encounter (HOSPITAL_BASED_OUTPATIENT_CLINIC_OR_DEPARTMENT_OTHER): Payer: Self-pay | Admitting: Plastic Surgery

## 2021-05-27 ENCOUNTER — Other Ambulatory Visit: Payer: Self-pay

## 2021-05-28 DIAGNOSIS — J029 Acute pharyngitis, unspecified: Secondary | ICD-10-CM | POA: Diagnosis not present

## 2021-06-05 NOTE — Progress Notes (Signed)
° ° ° ° ° °  Patient Instructions  The night before surgery:  No food after midnight. ONLY clear liquids after midnight  The day of surgery (if you do NOT have diabetes):  Drink ONE (1) Pre-Surgery Clear Ensure as directed.   This drink was given to you during your hospital  pre-op appointment visit. The pre-op nurse will instruct you on the time to drink the  Pre-Surgery Ensure depending on your surgery time. Finish the drink at the designated time by the pre-op nurse.  Nothing else to drink after completing the  Pre-Surgery Clear Ensure.  The day of surgery (if you have diabetes): Drink ONE (1) Gatorade 2 (G2) as directed. This drink was given to you during your hospital  pre-op appointment visit.  The pre-op nurse will instruct you on the time to drink the   Gatorade 2 (G2) depending on your surgery time. Color of the Gatorade may vary. Red is not allowed. Nothing else to drink after completing the  Gatorade 2 (G2).         If you have questions, please contact your surgeon's office.Patient was provided with CHG cleanser to use at home before the procedure. Patient verbalized understanding of instructions. 

## 2021-06-05 NOTE — Progress Notes (Signed)
Patient expressed concerns for nausea and vomiting post op and still experienced this even with scope patch will let anesthesia know.

## 2021-06-05 NOTE — Progress Notes (Signed)
Patient notified of need to come in today for soap and drink

## 2021-06-09 ENCOUNTER — Ambulatory Visit (HOSPITAL_BASED_OUTPATIENT_CLINIC_OR_DEPARTMENT_OTHER): Payer: BC Managed Care – PPO | Admitting: Anesthesiology

## 2021-06-09 ENCOUNTER — Ambulatory Visit (HOSPITAL_BASED_OUTPATIENT_CLINIC_OR_DEPARTMENT_OTHER)
Admission: RE | Admit: 2021-06-09 | Discharge: 2021-06-09 | Disposition: A | Discharge status: Home / Self Care | Payer: BC Managed Care – PPO | Attending: Plastic Surgery | Admitting: Plastic Surgery

## 2021-06-09 ENCOUNTER — Encounter (HOSPITAL_BASED_OUTPATIENT_CLINIC_OR_DEPARTMENT_OTHER)
Admission: RE | Disposition: A | Discharge status: Home / Self Care | Payer: Self-pay | Source: Home / Self Care | Attending: Plastic Surgery

## 2021-06-09 ENCOUNTER — Other Ambulatory Visit: Payer: Self-pay

## 2021-06-09 ENCOUNTER — Encounter (HOSPITAL_BASED_OUTPATIENT_CLINIC_OR_DEPARTMENT_OTHER): Payer: Self-pay | Admitting: Plastic Surgery

## 2021-06-09 DIAGNOSIS — Z9013 Acquired absence of bilateral breasts and nipples: Secondary | ICD-10-CM | POA: Insufficient documentation

## 2021-06-09 DIAGNOSIS — Z853 Personal history of malignant neoplasm of breast: Secondary | ICD-10-CM | POA: Insufficient documentation

## 2021-06-09 DIAGNOSIS — Z85850 Personal history of malignant neoplasm of thyroid: Secondary | ICD-10-CM | POA: Diagnosis not present

## 2021-06-09 DIAGNOSIS — Z421 Encounter for breast reconstruction following mastectomy: Secondary | ICD-10-CM | POA: Insufficient documentation

## 2021-06-09 DIAGNOSIS — Z803 Family history of malignant neoplasm of breast: Secondary | ICD-10-CM | POA: Diagnosis not present

## 2021-06-09 HISTORY — PX: REMOVAL OF BILATERAL TISSUE EXPANDERS WITH PLACEMENT OF BILATERAL BREAST IMPLANTS: SHX6431

## 2021-06-09 LAB — POCT PREGNANCY, URINE: Preg Test, Ur: NEGATIVE

## 2021-06-09 SURGERY — REMOVAL, TISSUE EXPANDER, BREAST, BILATERAL, WITH BILATERAL IMPLANT IMPLANT INSERTION
Anesthesia: General | Site: Chest | Laterality: Bilateral

## 2021-06-09 MED ORDER — ONDANSETRON HCL 4 MG/2ML IJ SOLN
INTRAMUSCULAR | Status: AC
  Filled 2021-06-09: qty 2

## 2021-06-09 MED ORDER — PROPOFOL 10 MG/ML IV BOLUS
INTRAVENOUS | Status: DC | PRN
  Administered 2021-06-09: 200 mg via INTRAVENOUS

## 2021-06-09 MED ORDER — SODIUM CHLORIDE (PF) 0.9 % IJ SOLN
INTRAMUSCULAR | Status: DC | PRN
  Administered 2021-06-09 (×2): 1000 mL

## 2021-06-09 MED ORDER — CELECOXIB 200 MG PO CAPS
200.0000 mg | ORAL_CAPSULE | ORAL | Status: AC
  Administered 2021-06-09: 200 mg via ORAL

## 2021-06-09 MED ORDER — PROMETHAZINE HCL 25 MG/ML IJ SOLN
INTRAMUSCULAR | Status: AC
  Filled 2021-06-09: qty 1

## 2021-06-09 MED ORDER — APREPITANT 40 MG PO CAPS
ORAL_CAPSULE | ORAL | Status: AC
  Filled 2021-06-09: qty 1

## 2021-06-09 MED ORDER — SUGAMMADEX SODIUM 200 MG/2ML IV SOLN
INTRAVENOUS | Status: DC | PRN
Start: 2021-06-09 — End: 2021-06-09
  Administered 2021-06-09: 180 mg via INTRAVENOUS

## 2021-06-09 MED ORDER — MIDAZOLAM HCL 5 MG/5ML IJ SOLN
INTRAMUSCULAR | Status: DC | PRN
  Administered 2021-06-09: 2 mg via INTRAVENOUS

## 2021-06-09 MED ORDER — GABAPENTIN 300 MG PO CAPS
300.0000 mg | ORAL_CAPSULE | ORAL | Status: AC
  Administered 2021-06-09: 300 mg via ORAL

## 2021-06-09 MED ORDER — ACETAMINOPHEN 500 MG PO TABS
1000.0000 mg | ORAL_TABLET | ORAL | Status: AC
  Administered 2021-06-09: 1000 mg via ORAL

## 2021-06-09 MED ORDER — ONDANSETRON HCL 4 MG/2ML IJ SOLN
INTRAMUSCULAR | Status: DC | PRN
  Administered 2021-06-09: 4 mg via INTRAVENOUS

## 2021-06-09 MED ORDER — APREPITANT 40 MG PO CAPS
40.0000 mg | ORAL_CAPSULE | Freq: Once | ORAL | Status: AC
  Administered 2021-06-09: 40 mg via ORAL

## 2021-06-09 MED ORDER — CEFAZOLIN SODIUM-DEXTROSE 2-4 GM/100ML-% IV SOLN
INTRAVENOUS | Status: AC
  Filled 2021-06-09: qty 100

## 2021-06-09 MED ORDER — SUCCINYLCHOLINE CHLORIDE 200 MG/10ML IV SOSY
PREFILLED_SYRINGE | INTRAVENOUS | Status: AC
  Filled 2021-06-09: qty 10

## 2021-06-09 MED ORDER — OXYCODONE HCL 5 MG/5ML PO SOLN: 5.0000 mg | Freq: Once | ORAL | Status: DC | PRN

## 2021-06-09 MED ORDER — PHENYLEPHRINE 40 MCG/ML (10ML) SYRINGE FOR IV PUSH (FOR BLOOD PRESSURE SUPPORT)
PREFILLED_SYRINGE | INTRAVENOUS | Status: AC
  Filled 2021-06-09: qty 10

## 2021-06-09 MED ORDER — ATROPINE SULFATE 0.4 MG/ML IV SOLN
INTRAVENOUS | Status: AC
  Filled 2021-06-09: qty 1

## 2021-06-09 MED ORDER — SCOPOLAMINE 1 MG/3DAYS TD PT72
MEDICATED_PATCH | TRANSDERMAL | Status: DC | PRN
  Administered 2021-06-09: 1 via TRANSDERMAL

## 2021-06-09 MED ORDER — OXYCODONE HCL 5 MG PO TABS: 5.0000 mg | ORAL_TABLET | Freq: Once | ORAL | Status: DC | PRN

## 2021-06-09 MED ORDER — PROMETHAZINE HCL 25 MG/ML IJ SOLN
6.2500 mg | INTRAMUSCULAR | Status: DC | PRN
  Administered 2021-06-09: 6.25 mg via INTRAVENOUS

## 2021-06-09 MED ORDER — CELECOXIB 200 MG PO CAPS: 200.0000 mg | ORAL_CAPSULE | Freq: Once | ORAL | Status: AC

## 2021-06-09 MED ORDER — BUPIVACAINE-EPINEPHRINE 0.25% -1:200000 IJ SOLN
INTRAMUSCULAR | Status: DC | PRN
  Administered 2021-06-09: 30 mL

## 2021-06-09 MED ORDER — CELECOXIB 200 MG PO CAPS
ORAL_CAPSULE | ORAL | Status: AC
Start: 2021-06-09 — End: ?
  Filled 2021-06-09: qty 1

## 2021-06-09 MED ORDER — FENTANYL CITRATE (PF) 100 MCG/2ML IJ SOLN
INTRAMUSCULAR | Status: AC
  Filled 2021-06-09: qty 2

## 2021-06-09 MED ORDER — FENTANYL CITRATE (PF) 100 MCG/2ML IJ SOLN
25.0000 ug | INTRAMUSCULAR | Status: DC | PRN
  Administered 2021-06-09: 50 ug via INTRAVENOUS

## 2021-06-09 MED ORDER — MIDAZOLAM HCL 2 MG/2ML IJ SOLN
INTRAMUSCULAR | Status: AC
Start: 2021-06-09 — End: ?
  Filled 2021-06-09: qty 2

## 2021-06-09 MED ORDER — SODIUM CHLORIDE 0.9 % IV SOLN
INTRAVENOUS | Status: DC | PRN
  Administered 2021-06-09: 500 mL

## 2021-06-09 MED ORDER — EPHEDRINE 5 MG/ML INJ
INTRAVENOUS | Status: AC
  Filled 2021-06-09: qty 5

## 2021-06-09 MED ORDER — CEFAZOLIN SODIUM-DEXTROSE 2-4 GM/100ML-% IV SOLN
2.0000 g | INTRAVENOUS | Status: AC
  Administered 2021-06-09: 2 g via INTRAVENOUS

## 2021-06-09 MED ORDER — LIDOCAINE 2% (20 MG/ML) 5 ML SYRINGE
INTRAMUSCULAR | Status: AC
  Filled 2021-06-09: qty 5

## 2021-06-09 MED ORDER — FENTANYL CITRATE (PF) 100 MCG/2ML IJ SOLN
INTRAMUSCULAR | Status: DC | PRN
  Administered 2021-06-09: 100 ug via INTRAVENOUS
  Administered 2021-06-09: 50 ug via INTRAVENOUS

## 2021-06-09 MED ORDER — EPHEDRINE SULFATE 50 MG/ML IJ SOLN
INTRAMUSCULAR | Status: DC | PRN
  Administered 2021-06-09 (×3): 10 mg via INTRAVENOUS

## 2021-06-09 MED ORDER — CHLORHEXIDINE GLUCONATE CLOTH 2 % EX PADS: 6.0000 | MEDICATED_PAD | Freq: Once | CUTANEOUS | Status: DC

## 2021-06-09 MED ORDER — ROCURONIUM BROMIDE 100 MG/10ML IV SOLN
INTRAVENOUS | Status: DC | PRN
  Administered 2021-06-09: 50 mg via INTRAVENOUS

## 2021-06-09 MED ORDER — ACETAMINOPHEN 500 MG PO TABS
ORAL_TABLET | ORAL | Status: AC
  Filled 2021-06-09: qty 2

## 2021-06-09 MED ORDER — GABAPENTIN 300 MG PO CAPS
ORAL_CAPSULE | ORAL | Status: AC
  Filled 2021-06-09: qty 1

## 2021-06-09 MED ORDER — PROPOFOL 500 MG/50ML IV EMUL
INTRAVENOUS | Status: DC | PRN
  Administered 2021-06-09: 20 ug/kg/min via INTRAVENOUS

## 2021-06-09 MED ORDER — ACETAMINOPHEN 500 MG PO TABS: 1000.0000 mg | ORAL_TABLET | Freq: Once | ORAL | Status: AC

## 2021-06-09 MED ORDER — LIDOCAINE HCL (CARDIAC) PF 100 MG/5ML IV SOSY
PREFILLED_SYRINGE | INTRAVENOUS | Status: DC | PRN
Start: 2021-06-09 — End: 2021-06-09
  Administered 2021-06-09: 60 mg via INTRAVENOUS

## 2021-06-09 MED ORDER — DEXAMETHASONE SODIUM PHOSPHATE 10 MG/ML IJ SOLN
INTRAMUSCULAR | Status: AC
  Filled 2021-06-09: qty 1

## 2021-06-09 MED ORDER — DEXAMETHASONE SODIUM PHOSPHATE 4 MG/ML IJ SOLN
INTRAMUSCULAR | Status: DC | PRN
  Administered 2021-06-09: 10 mg via INTRAVENOUS

## 2021-06-09 MED ORDER — ROCURONIUM BROMIDE 10 MG/ML (PF) SYRINGE
PREFILLED_SYRINGE | INTRAVENOUS | Status: AC
  Filled 2021-06-09: qty 10

## 2021-06-09 MED ORDER — LACTATED RINGERS IV SOLN: INTRAVENOUS | Status: DC

## 2021-06-09 SURGICAL SUPPLY — 84 items
ADH SKN CLS APL DERMABOND .7 (GAUZE/BANDAGES/DRESSINGS) ×2
APL PRP STRL LF DISP 70% ISPRP (MISCELLANEOUS) ×2
BAG DECANTER FOR FLEXI CONT (MISCELLANEOUS) ×3 IMPLANT
BINDER ABDOMINAL 10 UNV 27-48 (MISCELLANEOUS) IMPLANT
BINDER ABDOMINAL 12 SM 30-45 (SOFTGOODS) IMPLANT
BINDER BREAST 3XL (GAUZE/BANDAGES/DRESSINGS) IMPLANT
BINDER BREAST LRG (GAUZE/BANDAGES/DRESSINGS) IMPLANT
BINDER BREAST MEDIUM (GAUZE/BANDAGES/DRESSINGS) IMPLANT
BINDER BREAST XLRG (GAUZE/BANDAGES/DRESSINGS) ×1 IMPLANT
BINDER BREAST XXLRG (GAUZE/BANDAGES/DRESSINGS) IMPLANT
BLADE SURG 10 STRL SS (BLADE) ×4 IMPLANT
BLADE SURG 11 STRL SS (BLADE) ×3 IMPLANT
BNDG GAUZE ELAST 4 BULKY (GAUZE/BANDAGES/DRESSINGS) ×6 IMPLANT
CANISTER LIPO FAT HARVEST (MISCELLANEOUS) ×2 IMPLANT
CANISTER SUCT 1200ML W/VALVE (MISCELLANEOUS) ×3 IMPLANT
CHLORAPREP W/TINT 26 (MISCELLANEOUS) ×4 IMPLANT
COVER BACK TABLE 60X90IN (DRAPES) ×3 IMPLANT
COVER MAYO STAND STRL (DRAPES) ×6 IMPLANT
DECANTER SPIKE VIAL GLASS SM (MISCELLANEOUS) IMPLANT
DERMABOND ADVANCED (GAUZE/BANDAGES/DRESSINGS) ×2
DERMABOND ADVANCED .7 DNX12 (GAUZE/BANDAGES/DRESSINGS) ×4 IMPLANT
DRAIN CHANNEL 15F RND FF W/TCR (WOUND CARE) IMPLANT
DRAPE TOP ARMCOVERS (MISCELLANEOUS) ×3 IMPLANT
DRAPE U-SHAPE 76X120 STRL (DRAPES) ×3 IMPLANT
DRAPE UTILITY XL STRL (DRAPES) ×3 IMPLANT
DRSG PAD ABDOMINAL 8X10 ST (GAUZE/BANDAGES/DRESSINGS) ×6 IMPLANT
ELECT BLADE 4.0 EZ CLEAN MEGAD (MISCELLANEOUS)
ELECT COATED BLADE 2.86 ST (ELECTRODE) ×3 IMPLANT
ELECT REM PT RETURN 9FT ADLT (ELECTROSURGICAL) ×2
ELECTRODE BLDE 4.0 EZ CLN MEGD (MISCELLANEOUS) ×2 IMPLANT
ELECTRODE REM PT RTRN 9FT ADLT (ELECTROSURGICAL) ×2 IMPLANT
EVACUATOR SILICONE 100CC (DRAIN) IMPLANT
GLOVE SURG HYDRASOFT LTX SZ5.5 (GLOVE) ×6 IMPLANT
GOWN STRL REUS W/ TWL LRG LVL3 (GOWN DISPOSABLE) ×4 IMPLANT
GOWN STRL REUS W/TWL LRG LVL3 (GOWN DISPOSABLE) ×4
IMPL BREAST SAL 550-590CC (Breast) IMPLANT
IMPL BREAST SALINE 650-700C (Breast) IMPLANT
IMPLANT BREAST SAL 550-590CC (Breast) ×2 IMPLANT
IMPLANT BREAST SALINE 650-700C (Breast) ×2 IMPLANT
KIT FILL SYSTEM UNIVERSAL (SET/KITS/TRAYS/PACK) ×1 IMPLANT
LINER CANISTER 1000CC FLEX (MISCELLANEOUS) ×2 IMPLANT
MARKER SKIN DUAL TIP RULER LAB (MISCELLANEOUS) IMPLANT
NDL FILTER BLUNT 18X1 1/2 (NEEDLE) IMPLANT
NDL HYPO 25X1 1.5 SAFETY (NEEDLE) IMPLANT
NDL SAFETY ECLIPSE 18X1.5 (NEEDLE) ×2 IMPLANT
NEEDLE FILTER BLUNT 18X 1/2SAF (NEEDLE)
NEEDLE FILTER BLUNT 18X1 1/2 (NEEDLE) IMPLANT
NEEDLE HYPO 18GX1.5 SHARP (NEEDLE) ×2
NEEDLE HYPO 25X1 1.5 SAFETY (NEEDLE) ×2 IMPLANT
NS IRRIG 1000ML POUR BTL (IV SOLUTION) IMPLANT
PACK BASIN DAY SURGERY FS (CUSTOM PROCEDURE TRAY) ×3 IMPLANT
PAD ALCOHOL SWAB (MISCELLANEOUS) ×2 IMPLANT
PENCIL SMOKE EVACUATOR (MISCELLANEOUS) ×3 IMPLANT
PIN SAFETY STERILE (MISCELLANEOUS) IMPLANT
SHEET MEDIUM DRAPE 40X70 STRL (DRAPES) ×6 IMPLANT
SIZER BREAST SGL USE HP 550CC (SIZER) ×2
SIZER BREAST SGL USE HP 650CC (SIZER) ×2
SIZER BRST SGL USE HP 550CC (SIZER) IMPLANT
SIZER BRST SGL USE HP 650CC (SIZER) IMPLANT
SLEEVE SCD COMPRESS KNEE MED (STOCKING) ×3 IMPLANT
SPONGE T-LAP 18X18 ~~LOC~~+RFID (SPONGE) ×6 IMPLANT
STAPLER VISISTAT 35W (STAPLE) ×3 IMPLANT
SUT ETHILON 2 0 FS 18 (SUTURE) IMPLANT
SUT MNCRL AB 4-0 PS2 18 (SUTURE) ×5 IMPLANT
SUT PDS AB 2-0 CT2 27 (SUTURE) IMPLANT
SUT VIC AB 3-0 PS1 18 (SUTURE)
SUT VIC AB 3-0 PS1 18XBRD (SUTURE) IMPLANT
SUT VIC AB 3-0 SH 27 (SUTURE) ×4
SUT VIC AB 3-0 SH 27X BRD (SUTURE) ×4 IMPLANT
SUT VICRYL 4-0 PS2 18IN ABS (SUTURE) ×6 IMPLANT
SYR 10ML LL (SYRINGE) ×6 IMPLANT
SYR 20ML LL LF (SYRINGE) IMPLANT
SYR 50ML LL SCALE MARK (SYRINGE) ×2 IMPLANT
SYR BULB IRRIG 60ML STRL (SYRINGE) ×4 IMPLANT
SYR CONTROL 10ML LL (SYRINGE) ×1 IMPLANT
SYR TB 1ML LL NO SAFETY (SYRINGE) ×2 IMPLANT
SYR TOOMEY IRRIG 70ML (MISCELLANEOUS)
SYRINGE TOOMEY IRRIG 70ML (MISCELLANEOUS) IMPLANT
TOWEL GREEN STERILE FF (TOWEL DISPOSABLE) ×6 IMPLANT
TUBE CONNECTING 20X1/4 (TUBING) ×3 IMPLANT
TUBING INFILTRATION IT-10001 (TUBING) ×3 IMPLANT
TUBING SET GRADUATE ASPIR 12FT (MISCELLANEOUS) ×2 IMPLANT
UNDERPAD 30X36 HEAVY ABSORB (UNDERPADS AND DIAPERS) ×6 IMPLANT
YANKAUER SUCT BULB TIP NO VENT (SUCTIONS) ×3 IMPLANT

## 2021-06-09 NOTE — Transfer of Care (Signed)
Immediate Anesthesia Transfer of Care Note  Patient: Renee [name]  Procedure(s) Performed: REMOVAL OF BILATERAL TISSUE EXPANDERS WITH PLACEMENT OF BILATERAL BREAST IMPLANTS (Bilateral: Chest)  Patient Location: PACU  Anesthesia Type:General  Level of Consciousness: drowsy  Airway & Oxygen Therapy: Patient Spontanous Breathing and Patient connected to face mask oxygen  Post-op Assessment: Report given to RN and Post -op Vital signs reviewed and stable  Post vital signs: Reviewed and stable  Last Vitals:  Vitals Value Taken Time  BP 119/58 06/09/21 1124  Temp    Pulse 74 06/09/21 1126  Resp 15 06/09/21 1126  SpO2 99 % 06/09/21 1126  Vitals shown include unvalidated device data.  Last Pain:  Vitals:   06/09/21 0842  TempSrc: Oral  PainSc: 0-No pain         Complications: No notable events documented.

## 2021-06-09 NOTE — Anesthesia Postprocedure Evaluation (Signed)
Anesthesia Post Note  Patient: Renee [name]  Procedure(s) Performed: REMOVAL OF BILATERAL TISSUE EXPANDERS WITH PLACEMENT OF BILATERAL BREAST IMPLANTS (Bilateral: Chest)     Patient location during evaluation: PACU Anesthesia Type: General Level of consciousness: awake and alert Pain management: pain level controlled Vital Signs Assessment: post-procedure vital signs reviewed and stable Respiratory status: spontaneous breathing, nonlabored ventilation and respiratory function stable Cardiovascular status: stable and blood pressure returned to baseline Anesthetic complications: no   No notable events documented.  Last Vitals:  Vitals:   06/09/21 1229 06/09/21 1241  BP: (!) 102/54 (!) 108/46  Pulse: (!) 54 60  Resp: 13 12  Temp:  (!) 36.4 C  SpO2: 95% 97%    Last Pain:  Vitals:   06/09/21 1241  TempSrc:   PainSc: Renee [name]

## 2021-06-09 NOTE — Discharge Instructions (Addendum)
°  Post Anesthesia Home Care Instructions  Activity: Get plenty of rest for the remainder of the day. A responsible individual must stay with you for 24 hours following the procedure.  For the next 24 hours, DO NOT: -Drive a car -Paediatric nurse -Drink alcoholic beverages -Take any medication unless instructed by your physician -Make any legal decisions or sign important papers.  Meals: Start with liquid foods such as gelatin or soup. Progress to regular foods as tolerated. Avoid greasy, spicy, heavy foods. If nausea and/or vomiting occur, drink only clear liquids until the nausea and/or vomiting subsides. Call your physician if vomiting continues.  Special Instructions/Symptoms: Your throat may feel dry or sore from the anesthesia or the breathing tube placed in your throat during surgery. If this causes discomfort, gargle with warm salt water. The discomfort should disappear within 24 hours.  If you had a scopolamine patch placed behind your ear for the management of post- operative nausea and/or vomiting:  1. The medication in the patch is effective for 72 hours, after which it should be removed.  Wrap patch in a tissue and discard in the trash. Wash hands thoroughly with soap and water. 2. You may remove the patch earlier than 72 hours if you experience unpleasant side effects which may include dry mouth, dizziness or visual disturbances. 3. Avoid touching the patch. Wash your hands with soap and water after contact with the patch.      Next dose of Tylenol can be given at 3:00pm if needed. Next dose of NSAID (Motrin/Aleve/Ibuprofen) can be given at 5:00pm if needed.

## 2021-06-09 NOTE — Op Note (Signed)
Operative Note   DATE OF OPERATION: 1.3.23  LOCATION: [facility]-outpatient  SURGICAL DIVISION: Plastic Surgery  PREOPERATIVE DIAGNOSES:  1. History left breast cancer 2. Acquired absence breasts  POSTOPERATIVE DIAGNOSES:  same  PROCEDURE:  Removal bilateral chest tissue expanders and placement saline implants.  SURGEON: Irene Limbo MD MBA  ASSISTANT: none  ANESTHESIA:  General.   EBL: 15 ml  COMPLICATIONS: None immediate.   INDICATIONS FOR PROCEDURE:  The patient, Renee [name], is a 51 y.o. female born on 26-Apr-1971, is here for staged breast recosntruction following bilateral skin reduction mastectomies with immediate prepectoral tissue expander reconstruction.   FINDINGS: Full incorporation ADM noted bilateral. Of note, AP malposition of left chest tissue expander noted upon entering cavity. Asymmetry thickness mastectomy flaps present with right thinner, especially over superior pole. Natrelle Smooth Round Saline High Profile implants placed bilateral. RIGHT REF 68HP-650 ml, filled to 690 ml, SN [id number] LEFT REF 68HP-550, filled to 550 ml, SN [id number]   DESCRIPTION OF PROCEDURE:  The patient's operative site was marked in the preoperative area. The patient was taken to the operating room. SCDs were placed and IV antibiotics were given. The patient's operative site was prepped and draped in a sterile fashion. A time out was performed and all information was confirmed to be correct. I began on left side. Incision made through prior inframammary fold scar and carried through superficial fascia to acellular demis. ADM incised. Expander removed. Full incorporation ADM noted. Superior capsulotomy performed. Sizer placed.   I then directed attention to right chest. Incision made in prior inframammary fold scar and implant cavity entered in similar manner. Expander removed and well incorporated ADM noted. Superior capsulotomy performed. Sizer placed. Patient brought to  upright sitting position. Natrelle Smooth Round High Profile 550 ml implant selected for left chest and 650 ml implant selected for right chest. Patient returned to supine position.   Each cavity irrigated with saline solution containing Betadine, Ancef, gentamicin solution. Hemostasis ensured.The implant was prepared and placed in right chest and implant orientation ensured. Left chest implant was prepared and placed in left chest, and implant orientation ensured. Implants filled to final fill volumes as above. Fill tubings removed from each chest and tab closure ensured.   Closure completed with 3-0 vicryl to close superficial fascia and ADM over each implant. 4-0 vicryl used to close dermis followed by 4-0 monocryl subcuticular. Dermabond applied to chest incisions. Dry dressing applied, followed by breast binder.   The patient was allowed to wake from anesthesia, extubated and taken to the recovery room in satisfactory condition.   SPECIMENS: none  DRAINS: none

## 2021-06-09 NOTE — Anesthesia Procedure Notes (Signed)
Procedure Name: Intubation Date/Time: 06/09/2021 9:22 AM Performed by: Willa Frater, CRNA Pre-anesthesia Checklist: Patient identified, Emergency Drugs available, Suction available and Patient being monitored Patient Re-evaluated:Patient Re-evaluated prior to induction Oxygen Delivery Method: Circle system utilized Preoxygenation: Pre-oxygenation with 100% oxygen Induction Type: IV induction Ventilation: Mask ventilation without difficulty Laryngoscope Size: Mac and 3 Grade View: Grade I Tube type: Oral Number of attempts: 1 Airway Equipment and Method: Stylet and Oral airway Placement Confirmation: ETT inserted through vocal cords under direct vision, positive ETCO2 and breath sounds checked- equal and bilateral Secured at: 21 cm Tube secured with: Tape Dental Injury: Teeth and Oropharynx as per pre-operative assessment

## 2021-06-09 NOTE — Anesthesia Preprocedure Evaluation (Addendum)
Anesthesia Evaluation  Patient identified by MRN, date of birth, ID band Patient awake    Reviewed: Allergy & Precautions, NPO status , Patient's Chart, lab work & pertinent test results  History of Anesthesia Complications (+) PONV and history of anesthetic complications  Airway Mallampati: II  TM Distance: >3 FB Neck ROM: Full    Dental  (+) Dental Advisory Given   Pulmonary neg pulmonary ROS,    Pulmonary exam normal        Cardiovascular negative cardio ROS Normal cardiovascular exam     Neuro/Psych PSYCHIATRIC DISORDERS Anxiety Depression negative neurological ROS     GI/Hepatic negative GI ROS, Neg liver ROS,   Endo/Other  Hypothyroidism  Hx thyroid cancer   Renal/GU negative Renal ROS  Female GU complaint     Musculoskeletal  (+) Arthritis ,   Abdominal   Peds  (+) ATTENTION DEFICIT DISORDER WITHOUT HYPERACTIVITY Hematology negative hematology ROS (+)   Anesthesia Other Findings   Reproductive/Obstetrics  Breast cancer                             Anesthesia Physical Anesthesia Plan  ASA: 2  Anesthesia Plan: General   Post-op Pain Management: Tylenol PO (pre-op) and Celebrex PO (pre-op)   Induction: Intravenous  PONV Risk Score and Plan: 4 or greater and Treatment may vary due to age or medical condition, Ondansetron, Dexamethasone, Midazolam, Scopolamine patch - Pre-op, Aprepitant and Propofol infusion  Airway Management Planned: Oral ETT  Additional Equipment: None  Intra-op Plan:   Post-operative Plan: Extubation in OR  Informed Consent: I have reviewed the patients History and Physical, chart, labs and discussed the procedure including the risks, benefits and alternatives for the proposed anesthesia with the patient or authorized representative who has indicated his/her understanding and acceptance.     Dental advisory given  Plan Discussed with: CRNA  and Anesthesiologist  Anesthesia Plan Comments:      Anesthesia Quick Evaluation

## 2021-06-09 NOTE — Interval H&P Note (Signed)
History and Physical Interval Note:  06/09/2021 8:43 AM  Renee [name]  has presented today for surgery, with the diagnosis of hx breast cancer, acquired absence breasts.  The various methods of treatment have been discussed with the patient and family. After consideration of risks, benefits and other options for treatment, the patient has consented to  removal bilateral chest tissue expanders and placement saline implants as a surgical intervention.  The patient's history has been reviewed, patient examined, no change in status, stable for surgery.  I have reviewed the patient's chart and labs.  Questions were answered to the patient's satisfaction.     Arnoldo Hooker Byanka Landrus

## 2021-06-10 ENCOUNTER — Encounter (HOSPITAL_BASED_OUTPATIENT_CLINIC_OR_DEPARTMENT_OTHER): Payer: Self-pay | Admitting: Plastic Surgery

## 2021-07-13 DIAGNOSIS — H524 Presbyopia: Secondary | ICD-10-CM | POA: Diagnosis not present

## 2021-07-13 DIAGNOSIS — H04123 Dry eye syndrome of bilateral lacrimal glands: Secondary | ICD-10-CM | POA: Diagnosis not present

## 2021-07-13 DIAGNOSIS — H2513 Age-related nuclear cataract, bilateral: Secondary | ICD-10-CM | POA: Diagnosis not present

## 2021-07-13 DIAGNOSIS — H43812 Vitreous degeneration, left eye: Secondary | ICD-10-CM | POA: Diagnosis not present

## 2021-08-18 DIAGNOSIS — F4322 Adjustment disorder with anxiety: Secondary | ICD-10-CM | POA: Diagnosis not present

## 2021-08-25 DIAGNOSIS — F4322 Adjustment disorder with anxiety: Secondary | ICD-10-CM | POA: Diagnosis not present

## 2021-08-27 ENCOUNTER — Other Ambulatory Visit: Payer: Self-pay

## 2021-08-27 DIAGNOSIS — C50412 Malignant neoplasm of upper-outer quadrant of left female breast: Secondary | ICD-10-CM

## 2021-08-28 NOTE — Progress Notes (Incomplete)
? ?Patient Care Team: ?Shon Baton, MD as PCP - General (Internal Medicine) ?Rockwell Germany, RN as Oncology Nurse Navigator ?Mauro Kaufmann, RN as Oncology Nurse Navigator ?Donnie Mesa, MD as Consulting Physician (General Surgery) ?Magrinat, Virgie Dad, MD (Inactive) as Consulting Physician (Oncology) ?Eppie Gibson, MD as Attending Physician (Radiation Oncology) ?Hollar, Katharine Look, MD as Referring Physician (Dermatology) ?Irene Limbo, MD as Consulting Physician (Plastic Surgery) ? ?DIAGNOSIS: No diagnosis found. ? ?SUMMARY OF ONCOLOGIC HISTORY: ?Oncology History  ?Malignant neoplasm of upper-outer quadrant of left breast in female, estrogen receptor positive ([place])  ?01/08/2021 Initial Diagnosis  ? Malignant neoplasm of upper-outer quadrant of left breast in female, estrogen receptor positive ([place]) ?  ?01/14/2021 Cancer Staging  ? Staging form: Breast, AJCC 8th Edition ?- Clinical: Stage IB (cT2, cN0, cM0, G2, ER+, PR+, HER2-) - Signed by Chauncey Cruel, MD on 01/14/2021 ?Histologic grading system: 3 grade system ? ?  ?01/20/2021 Genetic Testing  ? Negative hereditary cancer genetic testing: no pathogenic variants detected in [facility] Panel.  The report date is January 20, 2021.  ? ?The BRCAplus panel offered by [employer] and includes sequencing and deletion/duplication analysis for the following 8 genes: ATM, BRCA1, BRCA2, CDH1, CHEK2, PALB2, PTEN, and TP53.  Results of pan-cancer panel are pending.  ?  ?02/06/2021 Genetic Testing  ? Negative hereditary cancer genetic testing: no pathogenic variants detected in [facility] Panel or CancerNext-Expanded +RNAinsight Panel.  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  The report dates are January 20, 2021 and February 06, 2021, respectively.  ? ?The BRCAplus panel offered by [employer] and includes sequencing and deletion/duplication analysis for the following 8 genes: ATM, BRCA1, BRCA2, CDH1, CHEK2, PALB2,  PTEN, and TP53.  The CancerNext-Expanded gene panel offered by [facility] and includes sequencing, rearrangement, and RNA analysis for the following 77 genes: AIP, ALK, APC, ATM, AXIN2, BAP1, BARD1, BLM, BMPR1A, BRCA1, BRCA2, BRIP1, CDC73, CDH1, CDK4, CDKN1B, CDKN2A, CHEK2, CTNNA1, DICER1, FANCC, FH, FLCN, GALNT12, KIF1B, LZTR1, MAX, MEN1, MET, MLH1, MSH2, MSH3, MSH6, MUTYH, NBN, NF1, NF2, NTHL1, PALB2, PHOX2B, PMS2, POT1, PRKAR1A, PTCH1, PTEN, RAD51C, RAD51D, RB1, RECQL, RET, SDHA, SDHAF2, SDHB, SDHC, SDHD, SMAD4, SMARCA4, SMARCB1, SMARCE1, STK11, SUFU, TMEM127, TP53, TSC1, TSC2, VHL and XRCC2 (sequencing and deletion/duplication); EGFR, EGLN1, HOXB13, KIT, MITF, PDGFRA, POLD1, and POLE (sequencing only); EPCAM and GREM1 (deletion/duplication only).  ?  ? ? ?CHIEF COMPLIANT: Estrogen receptor positive breast cancer ? ?INTERVAL HISTORY: Renee [name] is a 51 y.o. with above mention Estrogen receptor positive breast cancer. She presents to the clinic today for a follow-up. ? ? ?ALLERGIES:  is allergic to erythromycin. ? ?MEDICATIONS:  ?Current Outpatient Medications  ?Medication Sig Dispense Refill  ? ADDERALL XR 10 MG 24 hr capsule Take 10 mg by mouth every morning.  0  ? Ascorbic Acid (VITAMIN C) 1000 MG tablet Take 2,000 mg by mouth in the morning, at noon, and at bedtime.    ? docusate sodium (COLACE) 100 MG capsule Take 100 mg by mouth 2 (two) times daily.    ? FLUoxetine (PROZAC) 20 MG tablet Take 10 mg by mouth daily.    ? levothyroxine (SYNTHROID, LEVOTHROID) 125 MCG tablet Take 1 tablet by mouth See admin instructions. Only on Sundays  3  ? levothyroxine (SYNTHROID, LEVOTHROID) 137 MCG tablet Take 1 tablet by mouth See admin instructions. For 6 days a week Mon - Sat  5  ? methocarbamol (ROBAXIN) 500 MG tablet Take 500 mg by  mouth 4 (four) times daily.    ? Multiple Vitamin (MULTIVITAMIN PO) Take 1 tablet by mouth daily.    ? omeprazole (PRILOSEC) 40 MG capsule Take 40 mg by mouth daily.    ?  ondansetron (ZOFRAN ODT) 8 MG disintegrating tablet Take 1 tablet (8 mg total) by mouth every 8 (eight) hours as needed for nausea or vomiting. 20 tablet 0  ? Selenium 100 MCG CAPS Take 100 mcg by mouth in the morning and at bedtime.    ? thiamine (VITAMIN B-1) 100 MG tablet Take 100 mg by mouth daily.    ? VITAMIN E PO Take 500 Units by mouth daily.    ? Zinc 25 MG TABS Take 25 mg by mouth daily.    ? ?No current facility-administered medications for this visit.  ? ? ?PHYSICAL EXAMINATION: ?ECOG PERFORMANCE STATUS: {CHL ONC ECOG XK:[id number]} ? ?There were no vitals filed for this visit. ?There were no vitals filed for this visit. ? ?BREAST:*** No palpable masses or nodules in either right or left breasts. No palpable axillary supraclavicular or infraclavicular adenopathy no breast tenderness or nipple discharge. (exam performed in the presence of a chaperone) ? ?LABORATORY DATA:  ?I have reviewed the data as listed ? ?  Latest Ref Rng & Units 02/13/2021  ?  3:16 AM 01/14/2021  ?  8:16 AM  ?CMP  ?Glucose 70 - 99 mg/dL 134   95    ?BUN 6 - 20 mg/dL 6   12    ?Creatinine 0.44 - 1.00 mg/dL 0.61   0.78    ?Sodium 135 - 145 mmol/L 135   136    ?Potassium 3.5 - 5.1 mmol/L 3.7   4.2    ?Chloride 98 - 111 mmol/L 102   104    ?CO2 22 - 32 mmol/L 25   23    ?Calcium 8.9 - 10.3 mg/dL 8.7   9.5    ?Total Protein 6.5 - 8.1 g/dL  7.0    ?Total Bilirubin 0.3 - 1.2 mg/dL  0.4    ?Alkaline Phos 38 - 126 U/L  72    ?AST 15 - 41 U/L  16    ?ALT 0 - 44 U/L  21    ? ? ?Lab Results  ?Component Value Date  ? WBC 12.9 (H) 02/13/2021  ? HGB 11.1 (L) 02/13/2021  ? HCT 33.4 (L) 02/13/2021  ? MCV 92.5 02/13/2021  ? PLT 212 02/13/2021  ? NEUTROABS 5.7 01/14/2021  ? ? ?ASSESSMENT & PLAN:  ?No problem-specific Assessment & Plan notes found for this encounter. ? ? ? ?No orders of the defined types were placed in this encounter. ? ?The patient has a good understanding of the overall plan. she agrees with it. she will call with any problems that may  develop before the next visit here. ?Total time spent: 30 mins including face to face time and time spent for planning, charting and co-ordination of care ? ? Suzzette Righter, CMA ?08/28/21 ? ? ? I Kryslyn Helbig, Burrel Legrand am scribing for Dr. Lindi Adie ? ?***  ?

## 2021-08-31 ENCOUNTER — Other Ambulatory Visit: Payer: Self-pay

## 2021-08-31 ENCOUNTER — Inpatient Hospital Stay: Payer: BC Managed Care – PPO | Attending: Hematology and Oncology

## 2021-08-31 ENCOUNTER — Inpatient Hospital Stay (HOSPITAL_BASED_OUTPATIENT_CLINIC_OR_DEPARTMENT_OTHER): Payer: BC Managed Care – PPO | Admitting: Hematology and Oncology

## 2021-08-31 DIAGNOSIS — Z170 Estrogen receptor positive status [ER+]: Secondary | ICD-10-CM | POA: Diagnosis not present

## 2021-08-31 DIAGNOSIS — Z9013 Acquired absence of bilateral breasts and nipples: Secondary | ICD-10-CM | POA: Diagnosis not present

## 2021-08-31 DIAGNOSIS — Z79899 Other long term (current) drug therapy: Secondary | ICD-10-CM | POA: Insufficient documentation

## 2021-08-31 DIAGNOSIS — C50412 Malignant neoplasm of upper-outer quadrant of left female breast: Secondary | ICD-10-CM | POA: Insufficient documentation

## 2021-08-31 LAB — CBC WITH DIFFERENTIAL (CANCER CENTER ONLY)
Abs Immature Granulocytes: 0.02 10*3/uL (ref 0.00–0.07)
Basophils Absolute: 0 10*3/uL (ref 0.0–0.1)
Basophils Relative: 1 %
Eosinophils Absolute: 0.1 10*3/uL (ref 0.0–0.5)
Eosinophils Relative: 1 %
HCT: 40.3 % (ref 36.0–46.0)
Hemoglobin: 13.7 g/dL (ref 12.0–15.0)
Immature Granulocytes: 0 %
Lymphocytes Relative: 19 %
Lymphs Abs: 1.5 10*3/uL (ref 0.7–4.0)
MCH: 30.8 pg (ref 26.0–34.0)
MCHC: 34 g/dL (ref 30.0–36.0)
MCV: 90.6 fL (ref 80.0–100.0)
Monocytes Absolute: 0.5 10*3/uL (ref 0.1–1.0)
Monocytes Relative: 6 %
Neutro Abs: 5.7 10*3/uL (ref 1.7–7.7)
Neutrophils Relative %: 73 %
Platelet Count: 222 10*3/uL (ref 150–400)
RBC: 4.45 MIL/uL (ref 3.87–5.11)
RDW: 12.2 % (ref 11.5–15.5)
WBC Count: 7.8 10*3/uL (ref 4.0–10.5)
nRBC: 0 % (ref 0.0–0.2)

## 2021-08-31 LAB — CMP (CANCER CENTER ONLY)
ALT: 14 U/L (ref 0–44)
AST: 13 U/L — ABNORMAL LOW (ref 15–41)
Albumin: 3.9 g/dL (ref 3.5–5.0)
Alkaline Phosphatase: 82 U/L (ref 38–126)
Anion gap: 5 (ref 5–15)
BUN: 10 mg/dL (ref 6–20)
CO2: 28 mmol/L (ref 22–32)
Calcium: 9 mg/dL (ref 8.9–10.3)
Chloride: 104 mmol/L (ref 98–111)
Creatinine: 0.64 mg/dL (ref 0.44–1.00)
GFR, Estimated: >60 mL/min (ref >60)
Glucose, Bld: 105 mg/dL — ABNORMAL HIGH (ref 70–99)
Potassium: 3.9 mmol/L (ref 3.5–5.1)
Sodium: 137 mmol/L (ref 135–145)
Total Bilirubin: 0.9 mg/dL (ref 0.3–1.2)
Total Protein: 6.5 g/dL (ref 6.5–8.1)

## 2021-08-31 NOTE — Progress Notes (Signed)
? ?Patient Care Team: ?Shon Baton, MD as PCP - General (Internal Medicine) ?Rockwell Germany, RN as Oncology Nurse Navigator ?Mauro Kaufmann, RN as Oncology Nurse Navigator ?Donnie Mesa, MD as Consulting Physician (General Surgery) ?Magrinat, Virgie Dad, MD (Inactive) as Consulting Physician (Oncology) ?Eppie Gibson, MD as Attending Physician (Radiation Oncology) ?Hollar, Katharine Look, MD as Referring Physician (Dermatology) ?Irene Limbo, MD as Consulting Physician (Plastic Surgery) ? ?DIAGNOSIS:  ?Encounter Diagnosis  ?Name Primary?  ? Malignant neoplasm of upper-outer quadrant of left breast in female, estrogen receptor positive ([place])   ? ? ?SUMMARY OF ONCOLOGIC HISTORY: ?Oncology History  ?Malignant neoplasm of upper-outer quadrant of left breast in female, estrogen receptor positive ([place])  ?01/08/2021 Initial Diagnosis  ? Malignant neoplasm of upper-outer quadrant of left breast in female, estrogen receptor positive ([place]) ?  ?01/14/2021 Cancer Staging  ? Staging form: Breast, AJCC 8th Edition ?- Clinical: Stage IB (cT2, cN0, cM0, G2, ER+, PR+, HER2-) - Signed by Chauncey Cruel, MD on 01/14/2021 ?Histologic grading system: 3 grade system ? ?  ?01/20/2021 Genetic Testing  ? Negative hereditary cancer genetic testing: no pathogenic variants detected in [facility] Panel.  The report date is January 20, 2021.  ? ?The BRCAplus panel offered by [employer] and includes sequencing and deletion/duplication analysis for the following 8 genes: ATM, BRCA1, BRCA2, CDH1, CHEK2, PALB2, PTEN, and TP53.  Results of pan-cancer panel are pending.  ?  ?02/06/2021 Genetic Testing  ? Negative hereditary cancer genetic testing: no pathogenic variants detected in [facility] BRCAPlus Panel or CancerNext-Expanded +RNAinsight Panel.  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  The report dates are January 20, 2021 and February 06, 2021, respectively.  ? ?The BRCAplus panel offered by [employer] and includes sequencing and deletion/duplication analysis for the following 8 genes: ATM, BRCA1, BRCA2, CDH1, CHEK2, PALB2, PTEN, and TP53.  The CancerNext-Expanded gene panel offered by [facility] and includes sequencing, rearrangement, and RNA analysis for the following 77 genes: AIP, ALK, APC, ATM, AXIN2, BAP1, BARD1, BLM, BMPR1A, BRCA1, BRCA2, BRIP1, CDC73, CDH1, CDK4, CDKN1B, CDKN2A, CHEK2, CTNNA1, DICER1, FANCC, FH, FLCN, GALNT12, KIF1B, LZTR1, MAX, MEN1, MET, MLH1, MSH2, MSH3, MSH6, MUTYH, NBN, NF1, NF2, NTHL1, PALB2, PHOX2B, PMS2, POT1, PRKAR1A, PTCH1, PTEN, RAD51C, RAD51D, RB1, RECQL, RET, SDHA, SDHAF2, SDHB, SDHC, SDHD, SMAD4, SMARCA4, SMARCB1, SMARCE1, STK11, SUFU, TMEM127, TP53, TSC1, TSC2, VHL and XRCC2 (sequencing and deletion/duplication); EGFR, EGLN1, HOXB13, KIT, MITF, PDGFRA, POLD1, and POLE (sequencing only); EPCAM and GREM1 (deletion/duplication only).  ?  ? ? ?CHIEF COMPLIANT: Estrogen receptor positive breast cancer ? ?INTERVAL HISTORY: Renee [name] is a 51 y.o. with above mention Estrogen receptor positive breast cancer. She presents to the clinic today for a follow-up. She complains of some pain in the breast. She started a diet where she juicing more in the morning and eating more plant base foods a lot vegetables. ? ? ?ALLERGIES:  is allergic to erythromycin. ? ?MEDICATIONS:  ?Current Outpatient Medications  ?Medication Sig Dispense Refill  ? ADDERALL XR 10 MG 24 hr capsule Take 10 mg by mouth every morning.  0  ? Ascorbic Acid (VITAMIN C) 1000 MG tablet Take 2,000 mg by mouth in the morning, at noon, and at bedtime.    ? docusate sodium (COLACE) 100 MG capsule Take 100 mg by mouth 2 (two) times daily.    ? FLUoxetine (PROZAC) 20 MG tablet Take 10 mg by mouth daily.    ? levothyroxine (SYNTHROID, LEVOTHROID) 125 MCG tablet  Take 1 tablet by mouth See admin instructions. Only on Sundays  3  ? levothyroxine (SYNTHROID, LEVOTHROID) 137 MCG tablet Take 1 tablet by mouth See  admin instructions. For 6 days a week Mon - Sat  5  ? methocarbamol (ROBAXIN) 500 MG tablet Take 500 mg by mouth 4 (four) times daily.    ? Multiple Vitamin (MULTIVITAMIN PO) Take 1 tablet by mouth daily.    ? omeprazole (PRILOSEC) 40 MG capsule Take 40 mg by mouth daily.    ? ondansetron (ZOFRAN ODT) 8 MG disintegrating tablet Take 1 tablet (8 mg total) by mouth every 8 (eight) hours as needed for nausea or vomiting. 20 tablet 0  ? Selenium 100 MCG CAPS Take 100 mcg by mouth in the morning and at bedtime.    ? thiamine (VITAMIN B-1) 100 MG tablet Take 100 mg by mouth daily.    ? VITAMIN E PO Take 500 Units by mouth daily.    ? Zinc 25 MG TABS Take 25 mg by mouth daily.    ? ?No current facility-administered medications for this visit.  ? ? ?PHYSICAL EXAMINATION: ?ECOG PERFORMANCE STATUS: 1 - Symptomatic but completely ambulatory ? ?Vitals:  ? 08/31/21 0947  ?BP: (!) 119/51  ?Pulse: 81  ?Resp: 18  ?Temp: 97.9 ?F (36.6 ?C)  ?SpO2: 97%  ? ?Filed Weights  ? 08/31/21 0947  ?Weight: 180 lb 9.6 oz (81.9 kg)  ? ? ?BREAST: No palpable masses or nodules in either right or left reconstructed breasts. No palpable axillary supraclavicular or infraclavicular adenopathy  . (exam performed in the presence of a chaperone) ? ?LABORATORY DATA:  ?I have reviewed the data as listed ? ?  Latest Ref Rng & Units 02/13/2021  ?  3:16 AM 01/14/2021  ?  8:16 AM  ?CMP  ?Glucose 70 - 99 mg/dL 134   95    ?BUN 6 - 20 mg/dL 6   12    ?Creatinine 0.44 - 1.00 mg/dL 0.61   0.78    ?Sodium 135 - 145 mmol/L 135   136    ?Potassium 3.5 - 5.1 mmol/L 3.7   4.2    ?Chloride 98 - 111 mmol/L 102   104    ?CO2 22 - 32 mmol/L 25   23    ?Calcium 8.9 - 10.3 mg/dL 8.7   9.5    ?Total Protein 6.5 - 8.1 g/dL  7.0    ?Total Bilirubin 0.3 - 1.2 mg/dL  0.4    ?Alkaline Phos 38 - 126 U/L  72    ?AST 15 - 41 U/L  16    ?ALT 0 - 44 U/L  21    ? ? ?Lab Results  ?Component Value Date  ? WBC 7.8 08/31/2021  ? HGB 13.7 08/31/2021  ? HCT 40.3 08/31/2021  ? MCV 90.6 08/31/2021   ? PLT 222 08/31/2021  ? NEUTROABS 5.7 08/31/2021  ? ? ?ASSESSMENT & PLAN:  ?Malignant neoplasm of upper-outer quadrant of left breast in female, estrogen receptor positive ([place]) ?01/05/2021: T2N0 stage Ib grade 2 IDC ER/PR positive HER2 negative Ki-67 20% ?01/20/2021: Genetics: Negative ?MammaPrint: High risk ?02/12/2021: Bilateral mastectomies: Right: Benign, left T2 N0 stage Ib grade 2 IDC focal positive anterior and superior margin ?02/26/2021: Patient refused chemotherapy and radiation and antiestrogen therapy ?06/09/2021: Bilateral breast reconstruction surgery (Dr. Iran Planas) ? ?Breast cancer surveillance: ?1.  Breast exam 08/31/2021: Benign ?2. no role of imaging studies since she had bilateral mastectomies ? ?Tamoxifen counseling we discussed the  risks and benefits of tamoxifen. These include but not limited to insomnia, hot flashes, mood changes, vaginal dryness, and weight gain. Although rare, serious side effects including endometrial cancer, risk of blood clots were also discussed. We strongly believe that the benefits far outweigh the risks. Patient understands these risks and consented to starting treatment. Planned treatment duration is 10 years. ? ?We discussed extensively about the antiestrogen therapy risks and benefits.  She is still unsure about it. ?The other option I presented to her was to do Signatera testing and if there was evidence of circulating tumor DNA then we can put her on antiestrogen therapy. ?She is interested in this approach and will study more about it and decide. ? ?Return to clinic in 6 months for follow-up ? ? ? ?No orders of the defined types were placed in this encounter. ? ?The patient has a good understanding of the overall plan. she agrees with it. she will call with any problems that may develop before the next visit here. ?Total time spent: 30 mins including face to face time and time spent for planning, charting and co-ordination of care ? ? Harriette Ohara,  MD ?08/31/21 ? ? ? I Gardiner Coins am scribing for Dr. Lindi Adie ? ?I have reviewed the above documentation for accuracy and completeness, and I agree with the above. ?  ?

## 2021-08-31 NOTE — Assessment & Plan Note (Addendum)
01/05/2021: T2N0 stage Ib grade 2 IDC ER/PR positive HER2 negative Ki-67 20% ?01/20/2021: Genetics: Negative ?MammaPrint: High risk ?02/12/2021: Bilateral mastectomies: Right: Benign, left T2 N0 stage Ib grade 2 IDC focal positive anterior and superior margin ?02/26/2021: Patient refused chemotherapy and radiation and antiestrogen therapy ?06/09/2021: Bilateral breast reconstruction surgery (Dr. Iran Planas) ? ?Breast cancer surveillance: ?1.  Breast exam 08/31/2021: Benign ?2. no role of imaging studies since she had bilateral mastectomies ? ?Tamoxifen counseling we discussed the risks and benefits of tamoxifen. These include but not limited to insomnia, hot flashes, mood changes, vaginal dryness, and weight gain. Although rare, serious side effects including endometrial cancer, risk of blood clots were also discussed. We strongly believe that the benefits far outweigh the risks. Patient understands these risks and consented to starting treatment. Planned treatment duration is 10 years. ? ?We discussed extensively about the antiestrogen therapy risks and benefits.  She is still unsure about it. ?The other option I presented to her was to do Signatera testing and if there was evidence of circulating tumor DNA then we can put her on antiestrogen therapy. ?She is interested in this approach and will study more about it and decide. ? ?Return to clinic in 6 months for follow-up ?

## 2021-09-04 DIAGNOSIS — K219 Gastro-esophageal reflux disease without esophagitis: Secondary | ICD-10-CM | POA: Diagnosis not present

## 2021-09-04 DIAGNOSIS — R1310 Dysphagia, unspecified: Secondary | ICD-10-CM | POA: Diagnosis not present

## 2021-09-08 ENCOUNTER — Ambulatory Visit: Payer: BC Managed Care – PPO | Admitting: Hematology and Oncology

## 2021-09-08 ENCOUNTER — Other Ambulatory Visit: Payer: BC Managed Care – PPO

## 2021-09-24 DIAGNOSIS — F4322 Adjustment disorder with anxiety: Secondary | ICD-10-CM | POA: Diagnosis not present

## 2021-10-06 DIAGNOSIS — F4322 Adjustment disorder with anxiety: Secondary | ICD-10-CM | POA: Diagnosis not present

## 2021-10-12 DIAGNOSIS — Z79899 Other long term (current) drug therapy: Secondary | ICD-10-CM | POA: Diagnosis not present

## 2021-10-12 DIAGNOSIS — E039 Hypothyroidism, unspecified: Secondary | ICD-10-CM | POA: Diagnosis not present

## 2021-10-15 ENCOUNTER — Telehealth (HOSPITAL_BASED_OUTPATIENT_CLINIC_OR_DEPARTMENT_OTHER): Payer: Self-pay | Admitting: Obstetrics & Gynecology

## 2021-10-15 NOTE — Telephone Encounter (Signed)
Patient called and wanted to know if Dr.Miller needs see her sooner then her annual due to her history . ?

## 2021-10-19 DIAGNOSIS — Z0000 Encounter for general adult medical examination without abnormal findings: Secondary | ICD-10-CM | POA: Diagnosis not present

## 2021-10-19 DIAGNOSIS — C50919 Malignant neoplasm of unspecified site of unspecified female breast: Secondary | ICD-10-CM | POA: Diagnosis not present

## 2021-10-19 DIAGNOSIS — Z1331 Encounter for screening for depression: Secondary | ICD-10-CM | POA: Diagnosis not present

## 2021-10-19 DIAGNOSIS — R82998 Other abnormal findings in urine: Secondary | ICD-10-CM | POA: Diagnosis not present

## 2021-10-19 DIAGNOSIS — Z1339 Encounter for screening examination for other mental health and behavioral disorders: Secondary | ICD-10-CM | POA: Diagnosis not present

## 2021-10-21 NOTE — Telephone Encounter (Signed)
Spoke with pt and let her know that her annual exam was due after 8/26. Advised that Dr. Sabra Heck is willing to see her anytime between now and then if she has any problems or concerns arise. Pt provided with appt for annual.  ?

## 2021-10-27 DIAGNOSIS — F4322 Adjustment disorder with anxiety: Secondary | ICD-10-CM | POA: Diagnosis not present

## 2021-11-05 DIAGNOSIS — R1310 Dysphagia, unspecified: Secondary | ICD-10-CM | POA: Diagnosis not present

## 2021-11-05 DIAGNOSIS — R1319 Other dysphagia: Secondary | ICD-10-CM | POA: Diagnosis not present

## 2021-11-05 DIAGNOSIS — Z1211 Encounter for screening for malignant neoplasm of colon: Secondary | ICD-10-CM | POA: Diagnosis not present

## 2021-11-05 DIAGNOSIS — K6389 Other specified diseases of intestine: Secondary | ICD-10-CM | POA: Diagnosis not present

## 2021-11-10 DIAGNOSIS — F4322 Adjustment disorder with anxiety: Secondary | ICD-10-CM | POA: Diagnosis not present

## 2021-11-13 DIAGNOSIS — C50919 Malignant neoplasm of unspecified site of unspecified female breast: Secondary | ICD-10-CM | POA: Diagnosis not present

## 2021-11-13 DIAGNOSIS — Z853 Personal history of malignant neoplasm of breast: Secondary | ICD-10-CM | POA: Diagnosis not present

## 2021-11-14 DIAGNOSIS — H5213 Myopia, bilateral: Secondary | ICD-10-CM | POA: Diagnosis not present

## 2021-12-15 DIAGNOSIS — E039 Hypothyroidism, unspecified: Secondary | ICD-10-CM | POA: Diagnosis not present

## 2021-12-17 DIAGNOSIS — Z853 Personal history of malignant neoplasm of breast: Secondary | ICD-10-CM | POA: Diagnosis not present

## 2021-12-17 DIAGNOSIS — Z9013 Acquired absence of bilateral breasts and nipples: Secondary | ICD-10-CM | POA: Diagnosis not present

## 2021-12-18 DIAGNOSIS — F4322 Adjustment disorder with anxiety: Secondary | ICD-10-CM | POA: Diagnosis not present

## 2022-02-01 ENCOUNTER — Other Ambulatory Visit (HOSPITAL_COMMUNITY)
Admission: RE | Admit: 2022-02-01 | Discharge: 2022-02-01 | Disposition: A | Discharge status: Home / Self Care | Payer: BC Managed Care – PPO | Source: Ambulatory Visit | Attending: Obstetrics & Gynecology | Admitting: Obstetrics & Gynecology

## 2022-02-01 ENCOUNTER — Ambulatory Visit (INDEPENDENT_AMBULATORY_CARE_PROVIDER_SITE_OTHER): Payer: BC Managed Care – PPO | Admitting: Obstetrics & Gynecology

## 2022-02-01 ENCOUNTER — Encounter (HOSPITAL_BASED_OUTPATIENT_CLINIC_OR_DEPARTMENT_OTHER): Payer: Self-pay | Admitting: Obstetrics & Gynecology

## 2022-02-01 VITALS — BP 106/40 | HR 70 | Ht 68.5 in | Wt 185.6 lb

## 2022-02-01 DIAGNOSIS — C50911 Malignant neoplasm of unspecified site of right female breast: Secondary | ICD-10-CM

## 2022-02-01 DIAGNOSIS — Z124 Encounter for screening for malignant neoplasm of cervix: Secondary | ICD-10-CM

## 2022-02-01 DIAGNOSIS — Z85850 Personal history of malignant neoplasm of thyroid: Secondary | ICD-10-CM

## 2022-02-01 DIAGNOSIS — Z01419 Encounter for gynecological examination (general) (routine) without abnormal findings: Secondary | ICD-10-CM

## 2022-02-01 DIAGNOSIS — Z803 Family history of malignant neoplasm of breast: Secondary | ICD-10-CM

## 2022-02-01 DIAGNOSIS — Z9013 Acquired absence of bilateral breasts and nipples: Secondary | ICD-10-CM | POA: Diagnosis not present

## 2022-02-01 DIAGNOSIS — E890 Postprocedural hypothyroidism: Secondary | ICD-10-CM

## 2022-02-01 NOTE — Progress Notes (Unsigned)
51 y.o. G1P1 Married White or Caucasian female here for annual exam.    They had a wonderful three week trip to [place].    Stopped OCPs last year.  Had cycle in December, then monthly from Feb through until May, and then August.  Flow is about 4 days.  Flow is heavy for two or three days and then just stops.  This is manageable for her.  Had lab work done at Dr. Keane Police office.  Lab work done 12/16/2021 showed LH 35 and [place] 28.  Estradiol was 105.  Discussed with pt that lab work is consistent with lab work.  Has done bilateral mastectomy, had tissue expanders, then saline implants.  Had nipple tattoos recently done.  [place] and TPTP, alternatively  Sexually active: Yes.    The current method of family planning is none.    Smoker:  no  Health Maintenance: Pap:  10/19/2019 Normal History of abnormal Pap:  no MMG:  12/05/2020 Negative Colonoscopy:  11/2021.  Follow up 10 years. Screening Labs: does with Dr. Virgina Jock   reports that she has never smoked. She has never used smokeless tobacco. She reports current alcohol use of about 1.0 - 2.0 standard drink of alcohol per week. She reports that she does not use drugs.  Past Medical History:  Diagnosis Date   Anxiety    Arthritis    hands and hips   Breast cancer ([place])    Cancer ([place]) 2013 Thyroid   Depression    Endometrial polyp 01/14/2016   Family history of breast cancer 01/14/2021   Fibroid, uterine    History of kidney stones    History of thyroid cancer 01/14/2021   Infertility, female    PONV (postoperative nausea and vomiting) 2013   Thyroid cancer ([place]) 2013   Urinary incontinence     Past Surgical History:  Procedure Laterality Date   BREAST RECONSTRUCTION WITH PLACEMENT OF TISSUE EXPANDER AND ALLODERM Bilateral 02/12/2021   Procedure: BILATERAL BREAST RECONSTRUCTION WITH PLACEMENT OF TISSUE EXPANDER AND ALLODERM;  Surgeon: Irene Limbo, MD;  Location: [facility];  Service: Plastics;  Laterality: Bilateral;    HYSTEROSCOPY WITH D & C  10/13/2017   polyp resection   MASTECTOMY W/ SENTINEL NODE BIOPSY Left 02/12/2021   Procedure: LEFT MASTECTOMY WITH SENTINEL LYMPH NODE BIOPSY;  Surgeon: Donnie Mesa, MD;  Location: [facility];  Service: General;  Laterality: Left;   REMOVAL OF BILATERAL TISSUE EXPANDERS WITH PLACEMENT OF BILATERAL BREAST IMPLANTS Bilateral 06/09/2021   Procedure: REMOVAL OF BILATERAL TISSUE EXPANDERS WITH PLACEMENT OF BILATERAL BREAST IMPLANTS;  Surgeon: Irene Limbo, MD;  Location: [facility];  Service: Plastics;  Laterality: Bilateral;   SIMPLE MASTECTOMY WITH AXILLARY SENTINEL NODE BIOPSY Right 02/12/2021   Procedure: RIGHT PROPHYLACTIC MASTECTOMY;  Surgeon: Donnie Mesa, MD;  Location: [facility];  Service: General;  Laterality: Right;   TOTAL THYROIDECTOMY  2013   WISDOM TOOTH EXTRACTION      Current Outpatient Medications  Medication Sig Dispense Refill   ADDERALL XR 10 MG 24 hr capsule Take 10 mg by mouth every morning.  0   Ascorbic Acid (VITAMIN C) 1000 MG tablet Take 1,000 mg by mouth in the morning, at noon, and at bedtime. Taking '9000mg'$      docusate sodium (COLACE) 100 MG capsule Take 100 mg by mouth 2 (two) times daily.     FLUoxetine (PROZAC) 20 MG tablet Take 10 mg by mouth daily.     levothyroxine (SYNTHROID, LEVOTHROID) 125 MCG tablet Take 1  tablet by mouth See admin instructions. Sun, Wed, Fri  3   levothyroxine (SYNTHROID, LEVOTHROID) 137 MCG tablet Take 1 tablet by mouth See admin instructions. Mon, Tues, Thur, Sat  5   methocarbamol (ROBAXIN) 500 MG tablet Take 500 mg by mouth 4 (four) times daily.     Multiple Vitamin (MULTIVITAMIN PO) Take 1 tablet by mouth daily.     omeprazole (PRILOSEC) 40 MG capsule Take 40 mg by mouth daily.     thiamine (VITAMIN B-1) 100 MG tablet Take 100 mg by mouth daily.     VITAMIN E PO Take 500 Units by mouth daily.     Zinc 25 MG TABS Take 25 mg by mouth daily.     No current facility-administered medications for this  visit.    Family History  Problem Relation Age of Onset   Breast cancer Mother 72       bilateral mastectomy   Cancer Mother    Vision loss Mother    Diabetes Father    Skin cancer Sister        basal cell carcinoma; head/face   Skin cancer Maternal Grandfather        nose; dx after 45   Breast cancer Other        maternal great aunt x2; dx before 25    ROS: Constitutional: negative Genitourinary:negative  Exam:   BP (!) 106/40 (BP Location: Right Arm, Patient Position: Sitting, Cuff Size: Large)   Pulse 70   Ht 5' 8.5" (1.74 m) Comment: Reported  Wt 185 lb 9.6 oz (84.2 kg)   LMP 01/25/2022 (Approximate)   BMI 27.81 kg/m   Height: 5' 8.5" (174 cm) (Reported)  General appearance: alert, cooperative and appears stated age Head: Normocephalic, without obvious abnormality, atraumatic Neck: no adenopathy, supple, symmetrical, trachea midline and thyroid surgically surgically absent Lungs: clear to auscultation bilaterally Breasts: {Exam; breast:13139::"normal appearance, no masses or tenderness"} Heart: regular rate and rhythm Abdomen: soft, non-tender; bowel sounds normal; no masses,  no organomegaly Extremities: extremities normal, atraumatic, no cyanosis or edema Skin: Skin color, texture, turgor normal. No rashes or lesions Lymph nodes: Cervical, supraclavicular, and axillary nodes normal. No abnormal inguinal nodes palpated Neurologic: Grossly normal   Pelvic: External genitalia:  no lesions              Urethra:  normal appearing urethra with no masses, tenderness or lesions              Bartholins and Skenes: normal                 Vagina: normal appearing vagina with normal color and no discharge, no lesions              Cervix: {exam; cervix:14595}              Pap taken: {yes no:314532} Bimanual Exam:  Uterus:  {exam; uterus:12215}              Adnexa: {exam; adnexa:12223}               Rectovaginal: Confirms               Anus:  normal sphincter tone, no  lesions  Chaperone, ***, CMA, was present for exam.  Assessment/Plan:

## 2022-02-03 DIAGNOSIS — Z9013 Acquired absence of bilateral breasts and nipples: Secondary | ICD-10-CM | POA: Insufficient documentation

## 2022-02-04 LAB — CYTOLOGY - PAP: Diagnosis: NEGATIVE

## 2022-03-03 ENCOUNTER — Inpatient Hospital Stay: Payer: BC Managed Care – PPO | Admitting: Hematology and Oncology

## 2022-04-22 DIAGNOSIS — E039 Hypothyroidism, unspecified: Secondary | ICD-10-CM | POA: Diagnosis not present

## 2022-05-07 NOTE — Progress Notes (Signed)
Patient Care Team: Shon Baton, MD as PCP - General (Internal Medicine) Rockwell Germany, RN as Oncology Nurse Navigator Tressie Ellis, Paulette Blanch, RN as Oncology Nurse Navigator Donnie Mesa, MD as Consulting Physician (General Surgery) Magrinat, Virgie Dad, MD (Inactive) as Consulting Physician (Oncology) Eppie Gibson, MD as Attending Physician (Radiation Oncology) Hollar, Katharine Look, MD as Referring Physician (Dermatology) Irene Limbo, MD as Consulting Physician (Plastic Surgery)  DIAGNOSIS: No diagnosis found.  SUMMARY OF ONCOLOGIC HISTORY: Oncology History  Malignant neoplasm of upper-outer quadrant of left breast in female, estrogen receptor positive ([place])  01/08/2021 Initial Diagnosis   Malignant neoplasm of upper-outer quadrant of left breast in female, estrogen receptor positive ([place])   01/14/2021 Cancer Staging   Staging form: Breast, AJCC 8th Edition - Clinical: Stage IB (cT2, cN0, cM0, G2, ER+, PR+, HER2-) - Signed by Chauncey Cruel, MD on 01/14/2021 Histologic grading system: 3 grade system   01/20/2021 Genetic Testing   Negative hereditary cancer genetic testing: no pathogenic variants detected in [facility] BRCAPl[facility] Panel.  The report date is January 20, 2021.   The BRCAplus panel offered by [employer] and includes sequencing and deletion/duplication analysis for the following 8 genes: ATM, BRCA1, BRCA2, CDH1, CHEK2, PALB2, PTEN, and TP53.  Results of pan-cancer panel are pending.    02/06/2021 Genetic Testing   Negative hereditary cancer genetic testing: no pathogenic variants detected in [facility] BR[facility]lus Panel or CancerNext-Expanded +RNAinsight Panel.  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  The report dates are January 20, 2021 and February 06, 2021, respectively.   The BRCAplus panel offered by [employer] and includes sequencing and deletion/duplication analysis for the following 8 genes: ATM, BRCA1, BRCA2, CDH1, CHEK2, PALB2,  PTEN, and TP53.  The CancerNext-Expanded gene panel offered by [facility] and includes sequencing, rearrangement, and RNA analysis for the following 77 genes: AIP, ALK, APC, ATM, AXIN2, BAP1, BARD1, BLM, BMPR1A, BRCA1, BRCA2, BRIP1, CDC73, CDH1, CDK4, CDKN1B, CDKN2A, CHEK2, CTNNA1, DICER1, FANCC, FH, FLCN, GALNT12, KIF1B, LZTR1, MAX, MEN1, MET, MLH1, MSH2, MSH3, MSH6, MUTYH, NBN, NF1, NF2, NTHL1, PALB2, PHOX2B, PMS2, POT1, PRKAR1A, PTCH1, PTEN, RAD51C, RAD51D, RB1, RECQL, RET, SDHA, SDHAF2, SDHB, SDHC, SDHD, SMAD4, SMARCA4, SMARCB1, SMARCE1, STK11, SUFU, TMEM127, TP53, TSC1, TSC2, VHL and XRCC2 (sequencing and deletion/duplication); EGFR, EGLN1, HOXB13, KIT, MITF, PDGFRA, POLD1, and POLE (sequencing only); EPCAM and GREM1 (deletion/duplication only).      CHIEF COMPLIANT: Estrogen receptor positive breast cancer    INTERVAL HISTORY: Renee [name] is a 51 y.o. with above-mentioned Estrogen receptor positive breast cancer. Currently on surveillance. She presents to the clinic today for a follow-up.    ALLERGIES:  is allergic to erythromycin.  MEDICATIONS:  Current Outpatient Medications  Medication Sig Dispense Refill   ADDERALL XR 10 MG 24 hr capsule Take 10 mg by mouth every morning.  0   Ascorbic Acid (VITAMIN C) 1000 MG tablet Take 1,000 mg by mouth in the morning, at noon, and at bedtime. Taking 9026m     docusate sodium (COLACE) 100 MG capsule Take 100 mg by mouth 2 (two) times daily.     FLUoxetine (PROZAC) 20 MG tablet Take 10 mg by mouth daily.     levothyroxine (SYNTHROID, LEVOTHROID) 125 MCG tablet Take 1 tablet by mouth See admin instructions. Sun, Wed, Fri  3   levothyroxine (SYNTHROID, LEVOTHROID) 137 MCG tablet Take 1 tablet by mouth See admin instructions. MSammuel Cooper Thur, Sat  5   methocarbamol (ROBAXIN) 500 MG tablet Take 500  mg by mouth 4 (four) times daily.     Multiple Vitamin (MULTIVITAMIN PO) Take 1 tablet by mouth daily.     omeprazole (PRILOSEC) 40 MG capsule  Take 40 mg by mouth daily.     thiamine (VITAMIN B-1) 100 MG tablet Take 100 mg by mouth daily.     VITAMIN E PO Take 500 Units by mouth daily.     Zinc 25 MG TABS Take 25 mg by mouth daily.     No current facility-administered medications for this visit.    PHYSICAL EXAMINATION: ECOG PERFORMANCE STATUS: {CHL ONC ECOG PS:[id number]}  There were no vitals filed for this visit. There were no vitals filed for this visit.  BREAST:*** No palpable masses or nodules in either right or left breasts. No palpable axillary supraclavicular or infraclavicular adenopathy no breast tenderness or nipple discharge. (exam performed in the presence of a chaperone)  LABORATORY DATA:  I have reviewed the data as listed    Latest Ref Rng & Units 08/31/2021    9:32 AM 02/13/2021    3:16 AM 01/14/2021    8:16 AM  CMP  Glucose 70 - 99 mg/dL 105  134  95   BUN 6 - 20 mg/dL _0 Creatinine 0.44 - 1.00 mg/dL 0.64  0.61  0.78   Sodium 135 - 145 mmol/L 137  135  136   Potassium 3.5 - 5.1 mmol/L 3.9  3.7  4.2   Chloride 98 - 111 mmol/L 104  102  104   CO2 22 - 32 mmol/L _1 Calcium 8.9 - 10.3 mg/dL 9.0  8.7  9.5   Total Protein 6.5 - 8.1 g/dL 6.5   7.0   Total Bilirubin 0.3 - 1.2 mg/dL 0.9   0.4   Alkaline Phos 38 - 126 U/L 82   72   AST 15 - 41 U/L 13   16   ALT 0 - 44 U/L 14   21     Lab Results  Component Value Date   WBC 7.8 08/31/2021   HGB 13.7 08/31/2021   HCT 40.3 08/31/2021   MCV 90.6 08/31/2021   PLT 222 08/31/2021   NEUTROABS 5.7 08/31/2021    ASSESSMENT & PLAN:  No problem-specific Assessment & Plan notes found for this encounter.    No orders of the defined types were placed in this encounter.  The patient has a good understanding of the overall plan. she agrees with it. she will call with any problems that may develop before the next visit here. Total time spent: 30 mins including face to face time and time spent for planning, charting and co-ordination of care    Suzzette Righter, [place] 05/07/22    I Gardiner Coins am scribing for Dr. Lindi Adie  ***

## 2022-05-10 ENCOUNTER — Other Ambulatory Visit: Payer: Self-pay

## 2022-05-10 ENCOUNTER — Inpatient Hospital Stay: Payer: BC Managed Care – PPO | Attending: Hematology and Oncology | Admitting: Hematology and Oncology

## 2022-05-10 VITALS — BP 114/55 | HR 77 | Temp 97.9°F | Resp 18 | Ht 68.5 in | Wt 188.5 lb

## 2022-05-10 DIAGNOSIS — C50412 Malignant neoplasm of upper-outer quadrant of left female breast: Secondary | ICD-10-CM | POA: Diagnosis not present

## 2022-05-10 DIAGNOSIS — Z853 Personal history of malignant neoplasm of breast: Secondary | ICD-10-CM | POA: Diagnosis not present

## 2022-05-10 DIAGNOSIS — Z170 Estrogen receptor positive status [ER+]: Secondary | ICD-10-CM | POA: Diagnosis not present

## 2022-05-10 DIAGNOSIS — Z9013 Acquired absence of bilateral breasts and nipples: Secondary | ICD-10-CM | POA: Insufficient documentation

## 2022-05-10 MED ORDER — RHODIOLA 300 MG PO CAPS
1.0000 | ORAL_CAPSULE | Freq: Every day | ORAL | Status: AC
Start: 2022-05-10 — End: ?

## 2022-05-10 NOTE — Assessment & Plan Note (Signed)
01/05/2021: T2N0 stage Ib grade 2 IDC ER/PR positive HER2 negative Ki-67 20% 01/20/2021: Genetics: Negative MammaPrint: High risk 02/12/2021: Bilateral mastectomies: Right: Benign, left T2 N0 stage Ib grade 2 IDC focal positive anterior and superior margin 02/26/2021: Patient refused chemotherapy and radiation and antiestrogen therapy 06/09/2021: Bilateral breast reconstruction surgery (Dr. Iran Planas)   Breast cancer surveillance: 1.  Breast exam 05/10/2022: Benign 2. no role of imaging studies since she had bilateral mastectomies   Tamoxifen: Patient is not interested in antiestrogen therapy. Return to clinic in 1 year for follow-up

## 2022-06-30 DIAGNOSIS — Z9013 Acquired absence of bilateral breasts and nipples: Secondary | ICD-10-CM | POA: Diagnosis not present

## 2022-06-30 DIAGNOSIS — Z853 Personal history of malignant neoplasm of breast: Secondary | ICD-10-CM | POA: Diagnosis not present

## 2022-07-05 DIAGNOSIS — D2261 Melanocytic nevi of right upper limb, including shoulder: Secondary | ICD-10-CM | POA: Diagnosis not present

## 2022-07-05 DIAGNOSIS — D225 Melanocytic nevi of trunk: Secondary | ICD-10-CM | POA: Diagnosis not present

## 2022-07-05 DIAGNOSIS — D2272 Melanocytic nevi of left lower limb, including hip: Secondary | ICD-10-CM | POA: Diagnosis not present

## 2022-07-05 DIAGNOSIS — D2262 Melanocytic nevi of left upper limb, including shoulder: Secondary | ICD-10-CM | POA: Diagnosis not present

## 2022-07-16 DIAGNOSIS — H2513 Age-related nuclear cataract, bilateral: Secondary | ICD-10-CM | POA: Diagnosis not present

## 2022-07-16 DIAGNOSIS — H43812 Vitreous degeneration, left eye: Secondary | ICD-10-CM | POA: Diagnosis not present

## 2022-07-16 DIAGNOSIS — H04123 Dry eye syndrome of bilateral lacrimal glands: Secondary | ICD-10-CM | POA: Diagnosis not present

## 2022-07-16 DIAGNOSIS — Z83518 Family history of other specified eye disorder: Secondary | ICD-10-CM | POA: Diagnosis not present

## 2022-10-28 DIAGNOSIS — K219 Gastro-esophageal reflux disease without esophagitis: Secondary | ICD-10-CM | POA: Diagnosis not present

## 2022-10-28 DIAGNOSIS — E039 Hypothyroidism, unspecified: Secondary | ICD-10-CM | POA: Diagnosis not present

## 2022-10-28 DIAGNOSIS — R7989 Other specified abnormal findings of blood chemistry: Secondary | ICD-10-CM | POA: Diagnosis not present

## 2022-11-04 DIAGNOSIS — C50919 Malignant neoplasm of unspecified site of unspecified female breast: Secondary | ICD-10-CM | POA: Diagnosis not present

## 2022-11-04 DIAGNOSIS — E039 Hypothyroidism, unspecified: Secondary | ICD-10-CM | POA: Diagnosis not present

## 2022-11-04 DIAGNOSIS — Z9013 Acquired absence of bilateral breasts and nipples: Secondary | ICD-10-CM | POA: Diagnosis not present

## 2022-11-04 DIAGNOSIS — Z0000 Encounter for general adult medical examination without abnormal findings: Secondary | ICD-10-CM | POA: Diagnosis not present

## 2022-11-04 DIAGNOSIS — E663 Overweight: Secondary | ICD-10-CM | POA: Diagnosis not present

## 2022-11-04 DIAGNOSIS — Z1331 Encounter for screening for depression: Secondary | ICD-10-CM | POA: Diagnosis not present

## 2022-11-04 DIAGNOSIS — R82998 Other abnormal findings in urine: Secondary | ICD-10-CM | POA: Diagnosis not present

## 2023-01-21 DIAGNOSIS — M545 Low back pain, unspecified: Secondary | ICD-10-CM | POA: Diagnosis not present

## 2023-01-31 DIAGNOSIS — M47896 Other spondylosis, lumbar region: Secondary | ICD-10-CM | POA: Diagnosis not present

## 2023-01-31 DIAGNOSIS — M545 Low back pain, unspecified: Secondary | ICD-10-CM | POA: Diagnosis not present

## 2023-01-31 DIAGNOSIS — M533 Sacrococcygeal disorders, not elsewhere classified: Secondary | ICD-10-CM | POA: Diagnosis not present

## 2023-02-10 DIAGNOSIS — M533 Sacrococcygeal disorders, not elsewhere classified: Secondary | ICD-10-CM | POA: Diagnosis not present

## 2023-02-17 ENCOUNTER — Encounter (HOSPITAL_BASED_OUTPATIENT_CLINIC_OR_DEPARTMENT_OTHER): Payer: Self-pay | Admitting: Obstetrics & Gynecology

## 2023-02-17 ENCOUNTER — Other Ambulatory Visit (HOSPITAL_COMMUNITY)
Admission: RE | Admit: 2023-02-17 | Discharge: 2023-02-17 | Disposition: A | Discharge status: Home / Self Care | Payer: BC Managed Care – PPO | Source: Ambulatory Visit | Attending: Obstetrics & Gynecology | Admitting: Obstetrics & Gynecology

## 2023-02-17 ENCOUNTER — Ambulatory Visit (HOSPITAL_BASED_OUTPATIENT_CLINIC_OR_DEPARTMENT_OTHER): Payer: BC Managed Care – PPO | Admitting: Obstetrics & Gynecology

## 2023-02-17 VITALS — BP 105/53 | HR 80 | Ht 68.5 in | Wt 195.8 lb

## 2023-02-17 DIAGNOSIS — E890 Postprocedural hypothyroidism: Secondary | ICD-10-CM

## 2023-02-17 DIAGNOSIS — Z01419 Encounter for gynecological examination (general) (routine) without abnormal findings: Secondary | ICD-10-CM | POA: Diagnosis not present

## 2023-02-17 DIAGNOSIS — C73 Malignant neoplasm of thyroid gland: Secondary | ICD-10-CM

## 2023-02-17 DIAGNOSIS — C50412 Malignant neoplasm of upper-outer quadrant of left female breast: Secondary | ICD-10-CM | POA: Diagnosis not present

## 2023-02-17 DIAGNOSIS — Z124 Encounter for screening for malignant neoplasm of cervix: Secondary | ICD-10-CM | POA: Insufficient documentation

## 2023-02-17 DIAGNOSIS — F419 Anxiety disorder, unspecified: Secondary | ICD-10-CM

## 2023-02-17 DIAGNOSIS — Z170 Estrogen receptor positive status [ER+]: Secondary | ICD-10-CM

## 2023-02-17 DIAGNOSIS — N951 Menopausal and female climacteric states: Secondary | ICD-10-CM

## 2023-02-17 MED ORDER — PAROXETINE HCL 10 MG PO TABS: 10.0000 mg | ORAL_TABLET | ORAL | 1 refills | Status: DC

## 2023-02-17 NOTE — Progress Notes (Signed)
52 y.o. G1P1 Married White or Caucasian female here for annual exam.  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  She has been very busy.    Son is at [employer] as a Printmaker.    Has been having menstrual cycles but last one was Jun 24 and the one prior was in February.  Prior to that, cycles were about every 4-6 weeks.  She is having night sweats and feels hot all of the time.  She has started Wellbutrin by Dr. Timothy Lasso.  She in 5 of prozac.  She is having some increased issues with sleep as well.    Patient's last menstrual period was 11/29/2022 (exact date).          Sexually active: Yes.        Upstream - 02/17/23 0841       Pregnancy Intention Screening   Does the patient want to become pregnant in the next year? No    Does the patient's partner want to become pregnant in the next year? No    Would the patient like to discuss contraceptive options today? No             Health Maintenance: Pap: 02/16/22, no HR HPV done as pt desired pap sooner History of abnormal Pap:  no MMG: 2022 Colonoscopy:  11/2021, follow up 10 years BMD: not indicated Screening Labs: done with Dr. Timothy Lasso.  Done in May.     reports that she has never smoked. She has never used smokeless tobacco. She reports current alcohol use of about 1.0 - 2.0 standard drink of alcohol per week. She reports that she does not use drugs.  Past Medical History:  Diagnosis Date   Anxiety    Arthritis    hands and hips   Breast cancer (HCC)    Cancer (HCC) 2013 Thyroid   Depression    Endometrial polyp 01/14/2016   Family history of breast cancer 01/14/2021   Fibroid, uterine    History of kidney stones    History of thyroid cancer 01/14/2021   Infertility, female    PONV (postoperative nausea and vomiting) 2013   Thyroid cancer (HCC) 2013   Urinary incontinence     Past Surgical History:  Procedure Laterality Date   BREAST RECONSTRUCTION WITH PLACEMENT OF TISSUE EXPANDER AND ALLODERM  Bilateral 02/12/2021   Procedure: BILATERAL BREAST RECONSTRUCTION WITH PLACEMENT OF TISSUE EXPANDER AND ALLODERM;  Surgeon: Glenna Fellows, MD;  Location: [facility];  Service: Plastics;  Laterality: Bilateral;   HYSTEROSCOPY WITH D & C  10/13/2017   polyp resection   MASTECTOMY W/ SENTINEL NODE BIOPSY Left 02/12/2021   Procedure: LEFT MASTECTOMY WITH SENTINEL LYMPH NODE BIOPSY;  Surgeon: Manus Rudd, MD;  Location: [facility];  Service: General;  Laterality: Left;   REMOVAL OF BILATERAL TISSUE EXPANDERS WITH PLACEMENT OF BILATERAL BREAST IMPLANTS Bilateral 06/09/2021   Procedure: REMOVAL OF BILATERAL TISSUE EXPANDERS WITH PLACEMENT OF BILATERAL BREAST IMPLANTS;  Surgeon: Glenna Fellows, MD;  Location: [facility];  Service: Plastics;  Laterality: Bilateral;   SIMPLE MASTECTOMY WITH AXILLARY SENTINEL NODE BIOPSY Right 02/12/2021   Procedure: RIGHT PROPHYLACTIC MASTECTOMY;  Surgeon: Manus Rudd, MD;  Location: [facility];  Service: General;  Laterality: Right;   TOTAL THYROIDECTOMY  2013   WISDOM TOOTH EXTRACTION      Current Outpatient Medications  Medication Sig Dispense Refill   Ascorbic Acid (VITAMIN C) 1000 MG tablet Take 1,000 mg by mouth in the  morning, at noon, and at bedtime. Taking 9000mg      buPROPion ER (WELLBUTRIN SR) 100 MG 12 hr tablet Take 100 mg by mouth every morning.     levothyroxine (SYNTHROID, LEVOTHROID) 125 MCG tablet Take 1 tablet by mouth See admin instructions. Sun, Wed, Fri  3   levothyroxine (SYNTHROID, LEVOTHROID) 137 MCG tablet Take 1 tablet by mouth See admin instructions. Mon, Tues, Thur, Sat  5   omeprazole (PRILOSEC) 40 MG capsule Take 40 mg by mouth daily.     Rhodiola 300 MG CAPS Take 1 capsule (300 mg total) by mouth daily.     thiamine (VITAMIN B-1) 100 MG tablet Take 100 mg by mouth daily.     VITAMIN E PO Take 500 Units by mouth daily.     Zinc 25 MG TABS Take 25 mg by mouth daily.     ADDERALL XR 10 MG 24 hr capsule Take 5 mg by mouth every  morning. (Patient not taking: Reported on 02/17/2023)  0   FLUoxetine (PROZAC) 20 MG tablet Take 10 mg by mouth daily. (Patient not taking: Reported on 02/17/2023)     No current facility-administered medications for this visit.    Family History  Problem Relation Age of Onset   Breast cancer Mother 68       bilateral mastectomy   Cancer Mother    Vision loss Mother    Diabetes Father    Skin cancer Sister        basal cell carcinoma; head/face   Skin cancer Maternal Grandfather        nose; dx after 80   Breast cancer Other        maternal great aunt x2; dx before 50    ROS: Constitutional: negative Genitourinary:negative  Exam:   BP (!) 105/53 (BP Location: Right Arm, Patient Position: Sitting, Cuff Size: Large)   Pulse 80   Ht 5' 8.5" (1.74 m)   Wt 195 lb 12.8 oz (88.8 kg)   LMP 11/29/2022 (Exact Date)   BMI 29.34 kg/m   Height: 5' 8.5" (174 cm)  General appearance: alert, cooperative and appears stated age Head: Normocephalic, without obvious abnormality, atraumatic Neck: no adenopathy, supple, symmetrical, trachea midline and thyroid surgically absent and no masses Lungs: clear to auscultation bilaterally Breasts: bilateral implants, no masses, skin changes, well healed scars, no LAD Heart: regular rate and rhythm Abdomen: soft, non-tender; bowel sounds normal; no masses,  no organomegaly Extremities: extremities normal, atraumatic, no cyanosis or edema Skin: Skin color, texture, turgor normal. No rashes or lesions Lymph nodes: Cervical, supraclavicular, and axillary nodes normal. No abnormal inguinal nodes palpated Neurologic: Grossly normal   Pelvic: External genitalia:  no lesions              Urethra:  normal appearing urethra with no masses, tenderness or lesions              Bartholins and Skenes: normal                 Vagina: normal appearing vagina with normal color and no discharge, no lesions              Cervix: no lesions              Pap taken:  Yes.   Bimanual Exam:  Uterus:  normal size, contour, position, consistency, mobility, non-tender              Adnexa: normal adnexa and no mass, fullness, tenderness  Rectovaginal: Confirms               Anus:  normal sphincter tone, no lesions  Chaperone, Hendricks Milo, CMA, was present for exam.  Assessment/Plan: 1. Well woman exam with routine gynecological exam - Pap smear obtained today with HR HPV - Mammogram not indicated due to bilateral mastectomy - Colonoscopy 2023 - Bone mineral density guidelines reviewed - lab work done with PCP, Dr. Timothy Lasso - vaccines reviewed/updated  2. Anxiety - PARoxetine (PAXIL) 10 MG tablet; Take 1 tablet (10 mg total) by mouth every morning.  Dispense: 30 tablet; Refill: 1  3. Malignant neoplasm of upper-outer quadrant of left breast in female, estrogen receptor positive (HCC)  4. Thyroid cancer (HCC)  5. 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 Vasomotor symptoms due to menopause

## 2023-02-17 NOTE — Patient Instructions (Signed)
Stop fluoxetine.   Start paroxetine 10mg  daily.    In one to two weeks, take the wellbutrin every other day for about two weeks and then stop.

## 2023-02-17 NOTE — Addendum Note (Signed)
Addended by: Harrie Jeans on: 02/17/2023 09:41 AM   Modules accepted: Orders

## 2023-02-18 ENCOUNTER — Encounter (HOSPITAL_BASED_OUTPATIENT_CLINIC_OR_DEPARTMENT_OTHER): Payer: Self-pay | Admitting: Obstetrics & Gynecology

## 2023-02-18 ENCOUNTER — Encounter: Payer: Self-pay | Admitting: Hematology and Oncology

## 2023-02-21 LAB — CYTOLOGY - PAP
Comment: NEGATIVE
Diagnosis: NEGATIVE
High risk HPV: NEGATIVE

## 2023-03-27 IMAGING — US US BREAST BX W LOC DEV 1ST LESION IMG BX SPEC US GUIDE*L*
1 series · 12 of 16 positions shown · non-contrast
Comparison: Previous exam(s).
COMPARISON: Previous exam(s).

Addendum:
CLINICAL DATA: Left breast mass.

EXAM:
ULTRASOUND GUIDED LEFT BREAST CORE NEEDLE BIOPSY

[Series 1: us breast bx w loc dev 1st lesion img bx spec us g · 0.09mm/px · 12 of 16 slices shown]
[im 1/16]
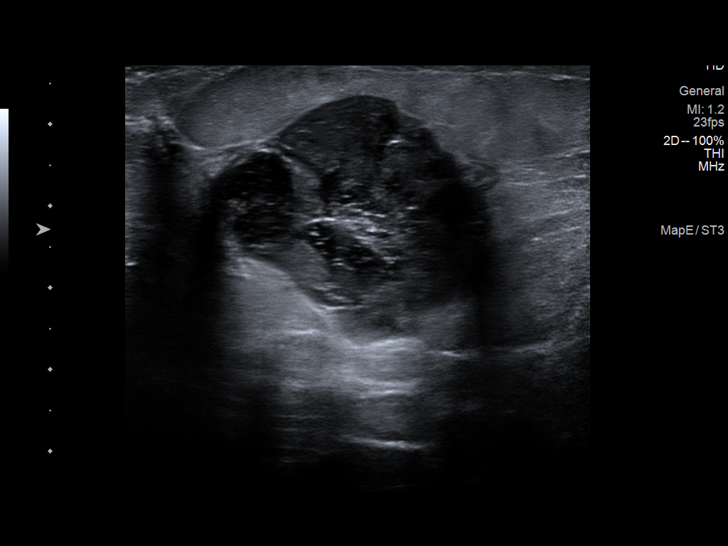
[im 3/16]
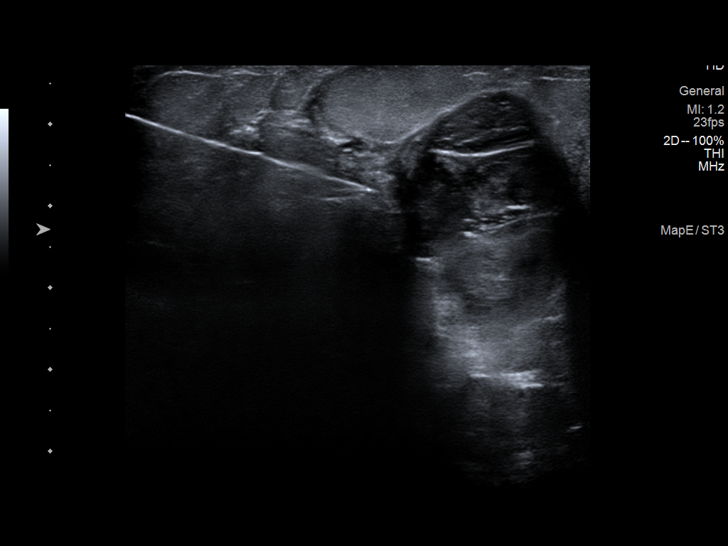
[im 4/16]
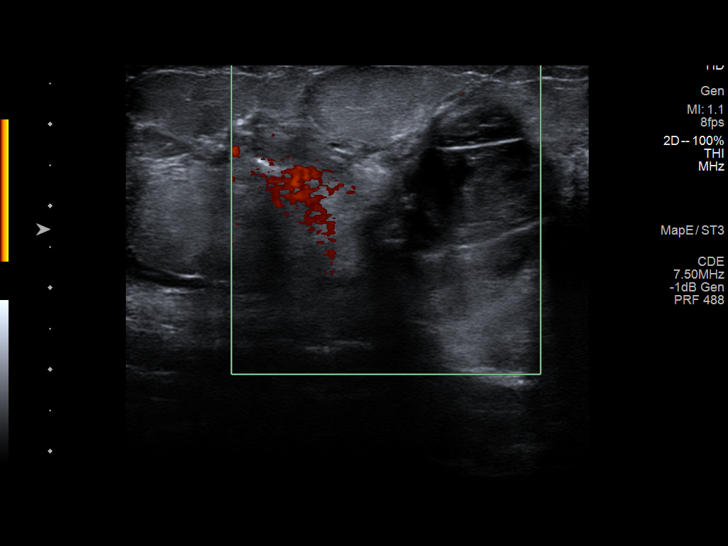
[im 5/16]
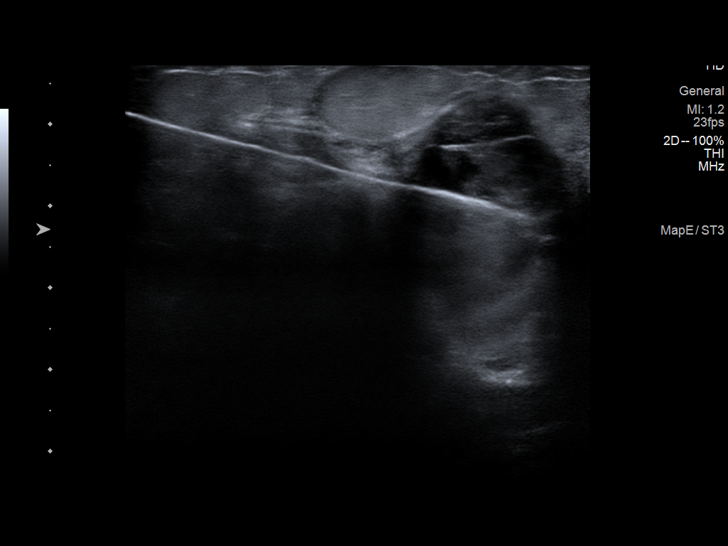
[im 7/16]
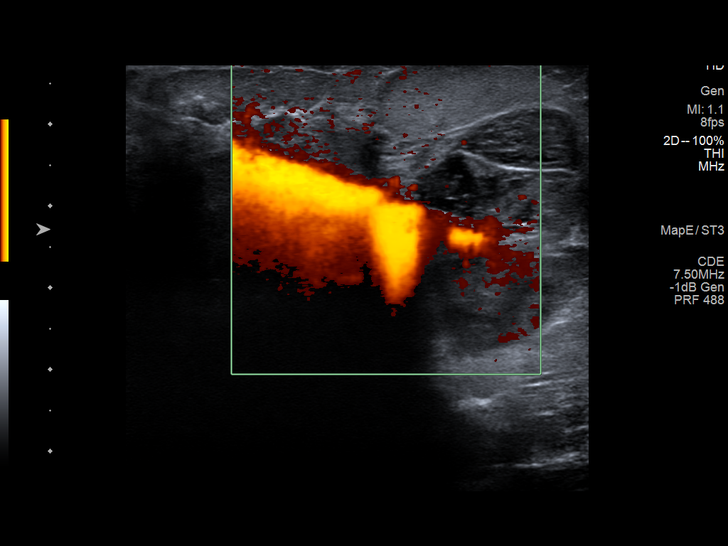
[im 8/16]
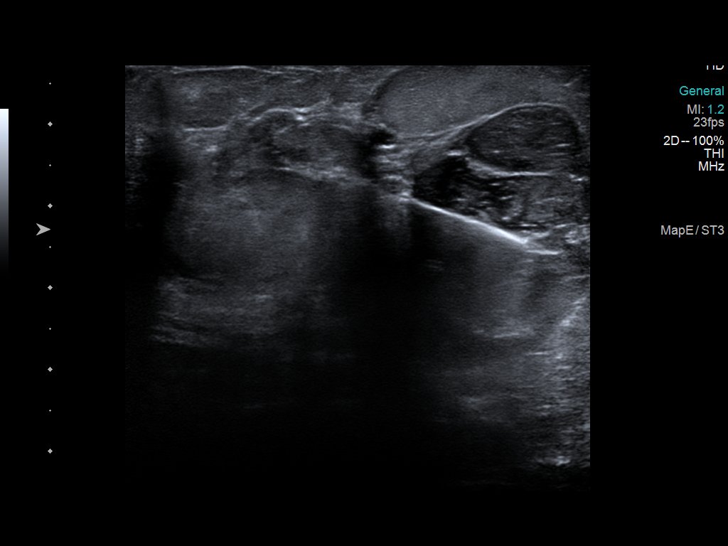
[im 9/16]
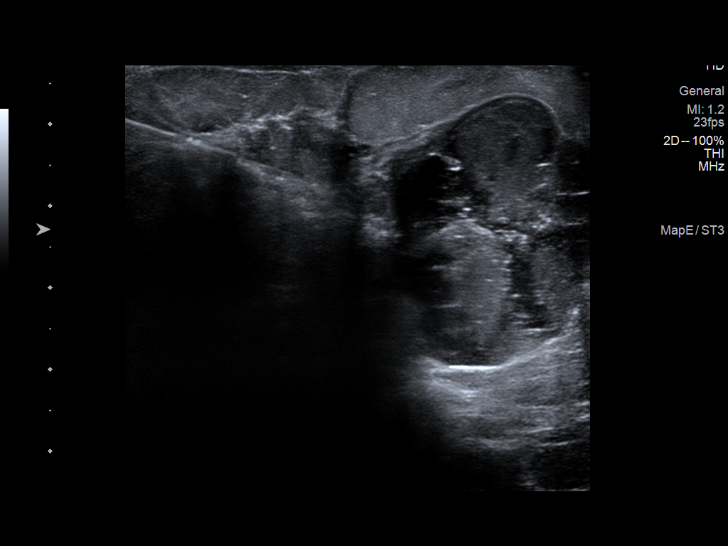
[im 11/16]
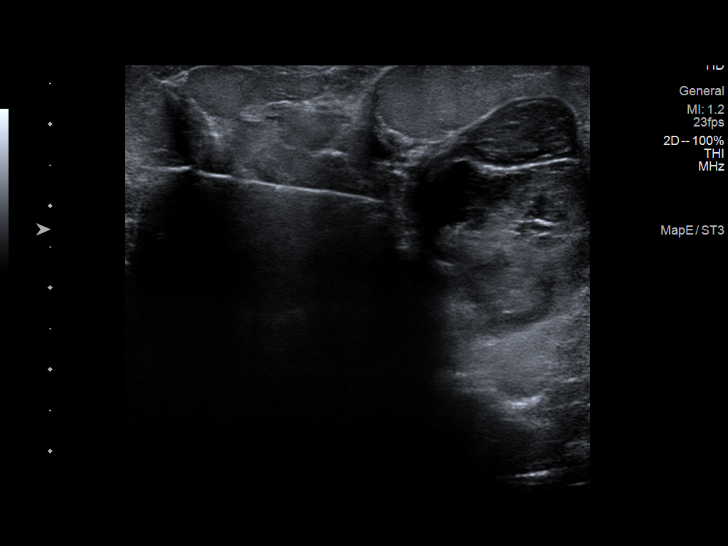
[im 12/16]
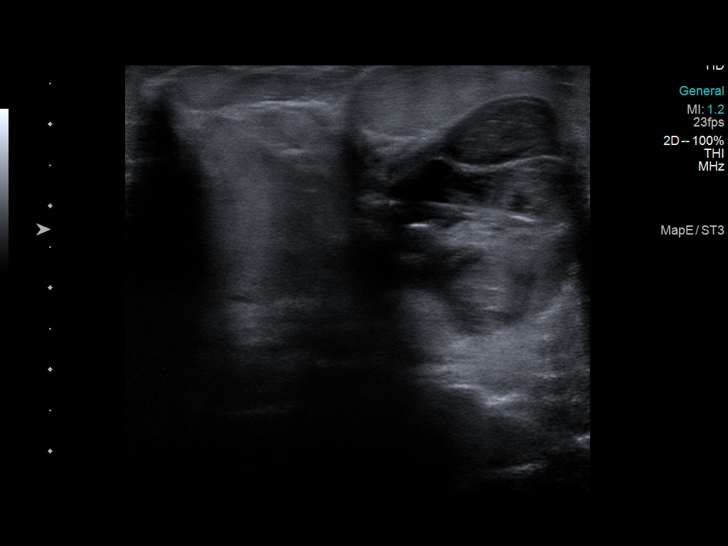
[im 13/16]
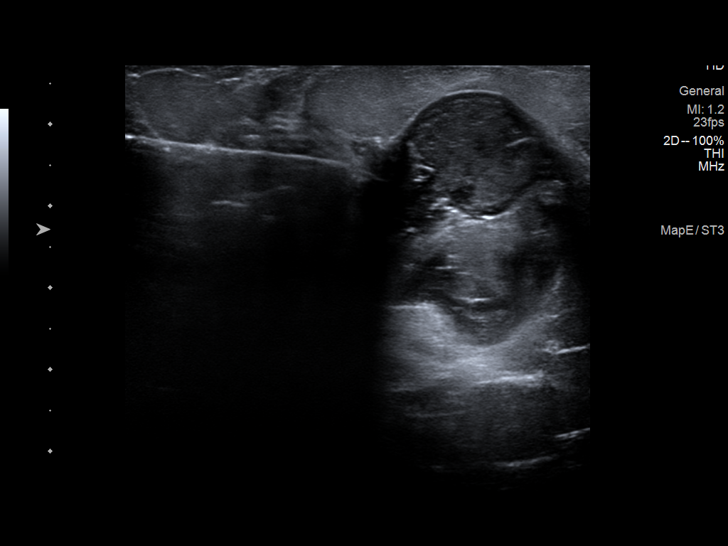
[im 15/16]
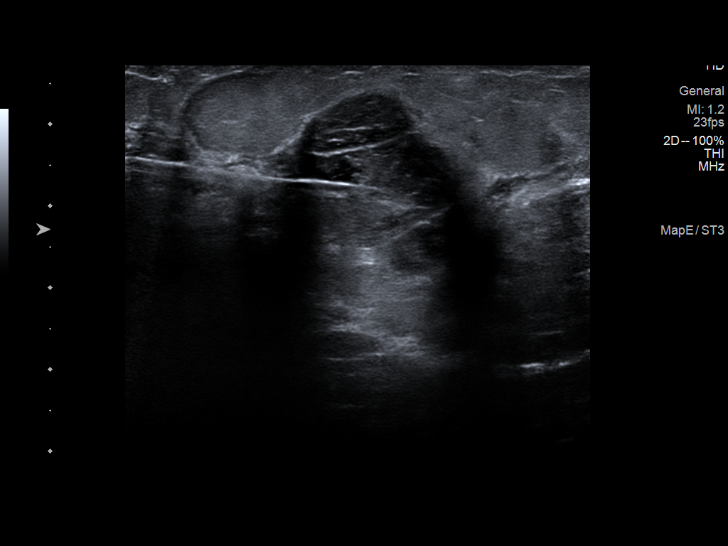
[im 16/16]
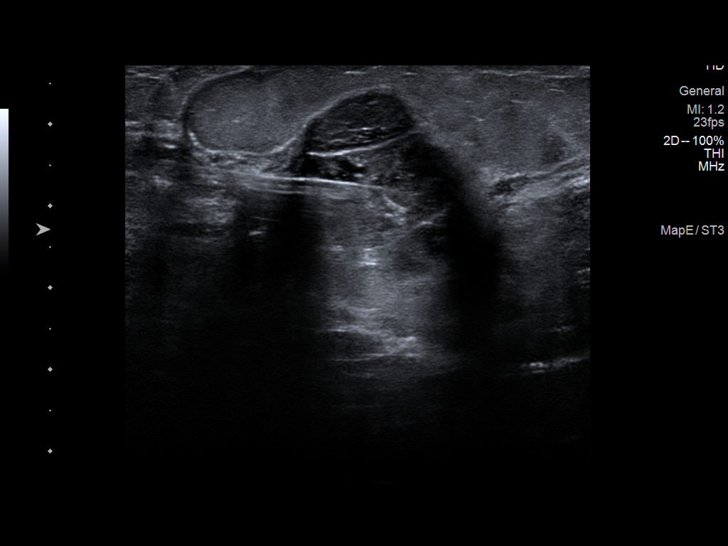

[12 of 16 positions shown; findings below may reference images not displayed]



Lesion quadrant: Upper-outer quadrant

Using sterile technique and 1% Lidocaine as local anesthetic, under
direct ultrasound visualization, a 14 gauge Aento device was
used to perform biopsy of a mass in the 1 o'clock region of the left
breast using a inferior to superior approach. At the conclusion of
the procedure ribbon shaped tissue marker clip was deployed into the
biopsy cavity. Follow up 2 view mammogram was performed and dictated
separately.
IMPRESSION: Ultrasound guided biopsy of the left breast. No apparent
complications.

ADDENDUM:
Pathology revealed GRADE II INVASIVE DUCTAL CARCINOMA WITH
EXTRACELLULAR MUCIN of the Left breast, upper outer quadrant,
(ribbon clip). This was found to be concordant by Dr. Grineeyy Hilmee.

Pathology results were discussed with the patient by telephone. The
patient reported doing well after the biopsy with tenderness at the
site. Post biopsy instructions and care were reviewed and questions
were answered. The patient was encouraged to call The [REDACTED] for any additional concerns. My direct phone
number was provided.

The patient was referred to [REDACTED]
[REDACTED] at [REDACTED] on
January 14, 2021.

Recommendation for a bilateral breast MRI given the patient's breast
tissue density, age and family history.

Pathology results reported by Maykel Maria, RN on 01/06/2021.



Lesion quadrant: Upper-outer quadrant

Using sterile technique and 1% Lidocaine as local anesthetic, under
direct ultrasound visualization, a 14 gauge Aento device was
used to perform biopsy of a mass in the 1 o'clock region of the left
breast using a inferior to superior approach. At the conclusion of
the procedure ribbon shaped tissue marker clip was deployed into the
biopsy cavity. Follow up 2 view mammogram was performed and dictated
separately.
IMPRESSION: Ultrasound guided biopsy of the left breast. No apparent
complications.

## 2023-03-27 IMAGING — MG MM BREAST LOCALIZATION CLIP
4 series · 4 of 12 positions shown · non-contrast
Comparison: Previous exam(s).

CLINICAL DATA: Status post ultrasound-guided core biopsy of a left
breast mass.

EXAM:
3D DIAGNOSTIC LEFT MAMMOGRAM POST ULTRASOUND BIOPSY

[L ML synth-2D]
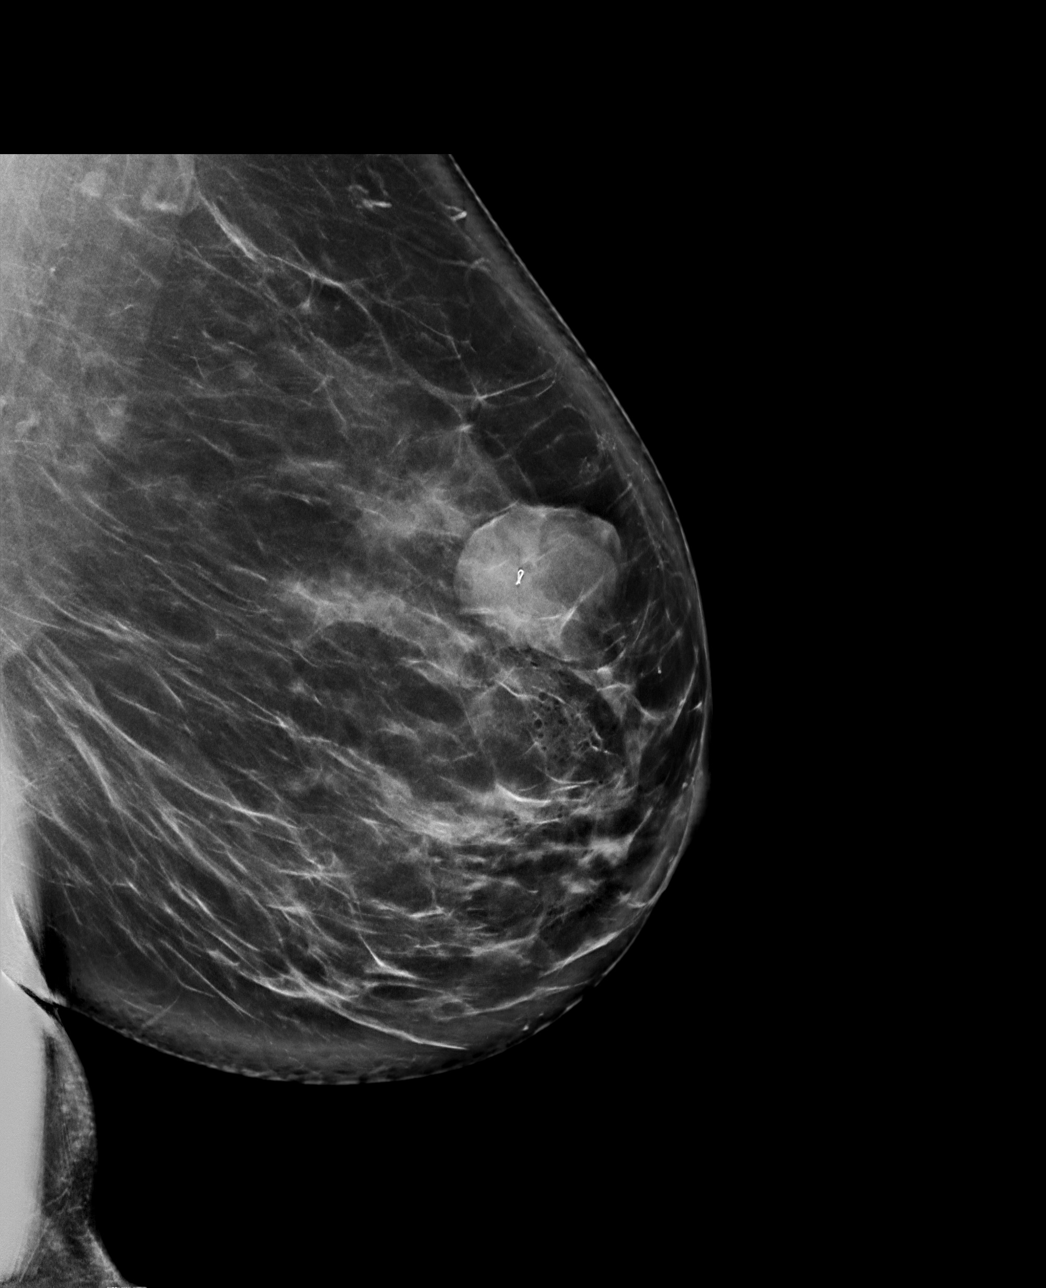

[L CC synth-2D]
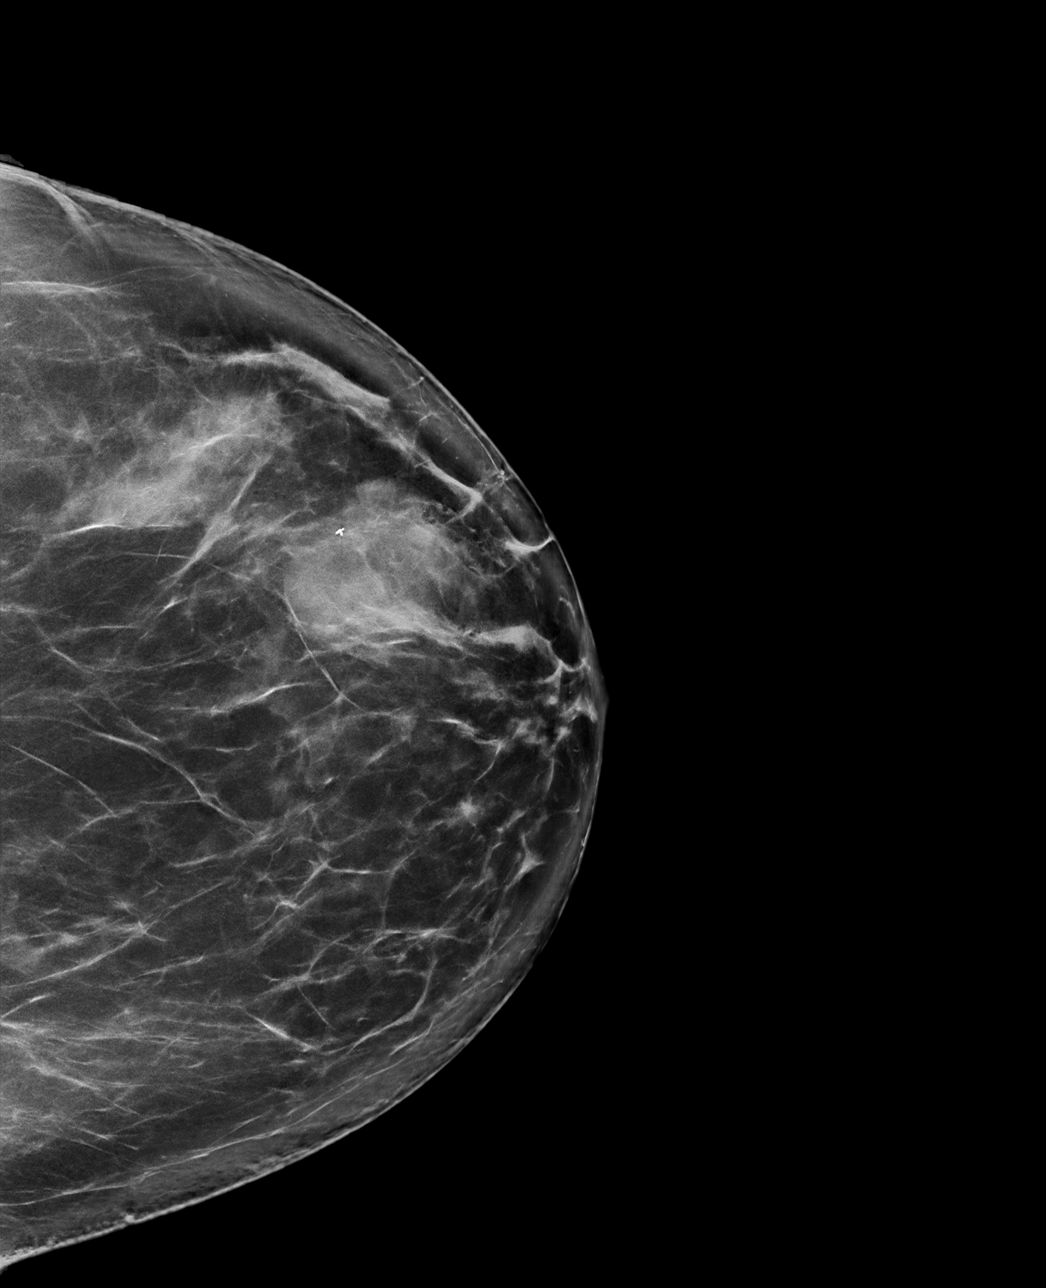

[L ML tomo · tomo slice 55/108.0]
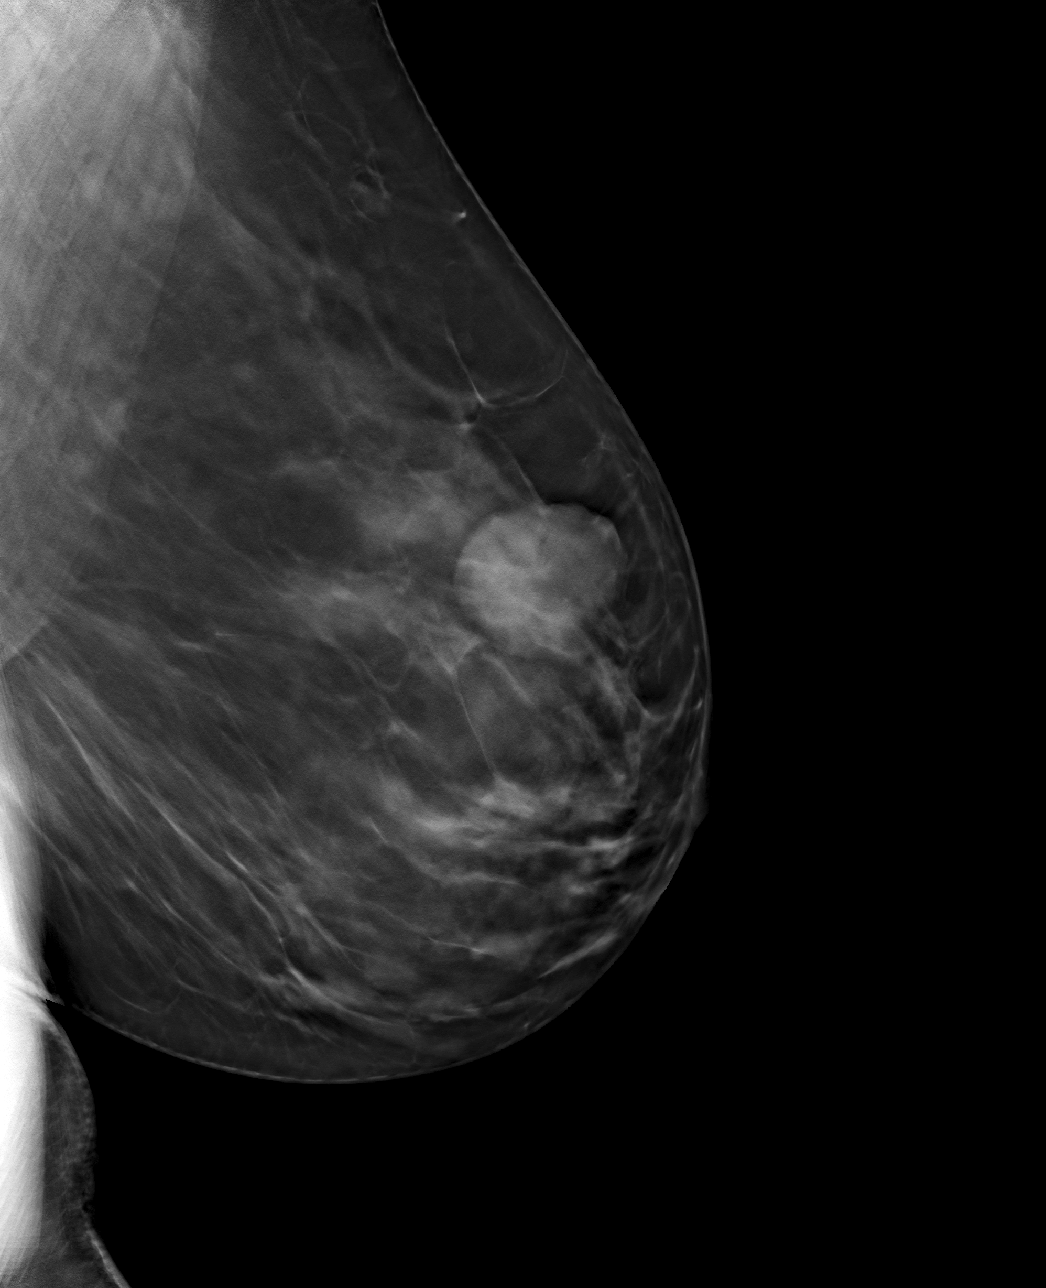

[L CC tomo · tomo slice 51/102.0]
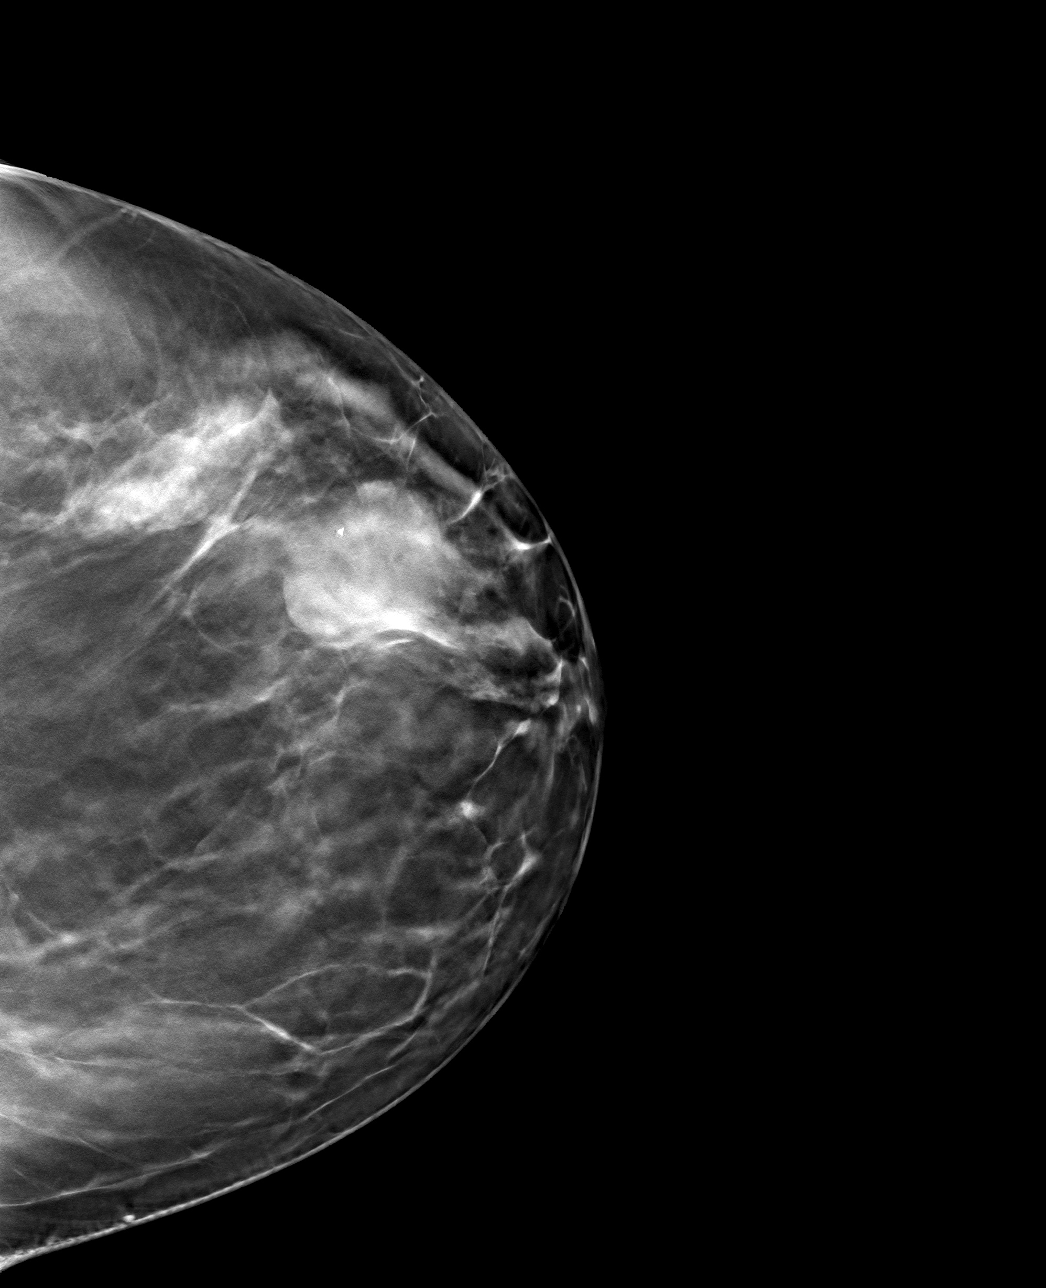

[4 of 12 positions shown; findings below may reference images not displayed]

FINDINGS: 3D Mammographic images were obtained following ultrasound guided
biopsy of a mass in the left breast. The biopsy marking clip is in
the upper-outer quadrant of the left breast.
IMPRESSION: Appropriate positioning of the ribbon shaped biopsy marking clip at
the site of biopsy in the upper-outer quadrant of the left breast.

Final Assessment: Post Procedure Mammograms for Marker Placement

## 2023-04-12 ENCOUNTER — Other Ambulatory Visit (HOSPITAL_BASED_OUTPATIENT_CLINIC_OR_DEPARTMENT_OTHER): Payer: Self-pay | Admitting: Obstetrics & Gynecology

## 2023-04-12 DIAGNOSIS — F419 Anxiety disorder, unspecified: Secondary | ICD-10-CM

## 2023-04-13 DIAGNOSIS — M79672 Pain in left foot: Secondary | ICD-10-CM | POA: Diagnosis not present

## 2023-04-28 DIAGNOSIS — M722 Plantar fascial fibromatosis: Secondary | ICD-10-CM | POA: Diagnosis not present

## 2023-05-11 ENCOUNTER — Inpatient Hospital Stay: Payer: BC Managed Care – PPO | Admitting: Hematology and Oncology

## 2023-05-23 DIAGNOSIS — C50919 Malignant neoplasm of unspecified site of unspecified female breast: Secondary | ICD-10-CM | POA: Diagnosis not present

## 2023-05-23 DIAGNOSIS — E663 Overweight: Secondary | ICD-10-CM | POA: Diagnosis not present

## 2023-05-23 DIAGNOSIS — E039 Hypothyroidism, unspecified: Secondary | ICD-10-CM | POA: Diagnosis not present

## 2023-05-23 DIAGNOSIS — Z85850 Personal history of malignant neoplasm of thyroid: Secondary | ICD-10-CM | POA: Diagnosis not present

## 2023-05-24 DIAGNOSIS — M722 Plantar fascial fibromatosis: Secondary | ICD-10-CM | POA: Diagnosis not present

## 2023-06-16 ENCOUNTER — Inpatient Hospital Stay: Payer: BC Managed Care – PPO | Attending: Hematology and Oncology | Admitting: Hematology and Oncology

## 2023-06-16 VITALS — BP 108/51 | HR 77 | Temp 97.7°F | Resp 17 | Ht 68.5 in | Wt 205.3 lb

## 2023-06-16 DIAGNOSIS — Z170 Estrogen receptor positive status [ER+]: Secondary | ICD-10-CM | POA: Diagnosis not present

## 2023-06-16 DIAGNOSIS — F419 Anxiety disorder, unspecified: Secondary | ICD-10-CM | POA: Diagnosis not present

## 2023-06-16 DIAGNOSIS — Z79899 Other long term (current) drug therapy: Secondary | ICD-10-CM | POA: Insufficient documentation

## 2023-06-16 DIAGNOSIS — Z9013 Acquired absence of bilateral breasts and nipples: Secondary | ICD-10-CM | POA: Insufficient documentation

## 2023-06-16 DIAGNOSIS — C50412 Malignant neoplasm of upper-outer quadrant of left female breast: Secondary | ICD-10-CM | POA: Insufficient documentation

## 2023-06-16 NOTE — Progress Notes (Signed)
 Patient Care Team: Onita Rush, MD as PCP - General (Internal Medicine) Tyree Nanetta SAILOR, RN as Oncology Nurse Navigator Glean, Stephane BROCKS, RN as Oncology Nurse Navigator Belinda Cough, MD as Consulting Physician (General Surgery) Magrinat, Sandria BROCKS, MD (Inactive) as Consulting Physician (Oncology) Izell Domino, MD as Attending Physician (Radiation Oncology) Hollar, Lahoma Greener, MD as Referring Physician (Dermatology) Arelia Filippo, MD as Consulting Physician (Plastic Surgery)  DIAGNOSIS:  Encounter Diagnosis  Name Primary?   Malignant neoplasm of upper-outer quadrant of left breast in female, estrogen receptor positive (HCC) Yes    SUMMARY OF ONCOLOGIC HISTORY: Oncology History  Malignant neoplasm of upper-outer quadrant of left breast in female, estrogen receptor positive (HCC)  01/08/2021 Initial Diagnosis   Malignant neoplasm of upper-outer quadrant of left breast in female, estrogen receptor positive (HCC)   01/14/2021 Cancer Staging   Staging form: Breast, AJCC 8th Edition - Clinical: Stage IB (cT2, cN0, cM0, G2, ER+, PR+, HER2-) - Signed by Layla Sandria BROCKS, MD on 01/14/2021 Histologic grading system: 3 grade system   01/20/2021 Genetic Testing   Negative hereditary cancer genetic testing: no pathogenic variants detected in [facility] BRCAPlus Panel.  The report date is January 20, 2021.   The BRCAplus panel offered by [employer] and includes sequencing and deletion/duplication analysis for the following 8 genes: ATM, BRCA1, BRCA2, CDH1, CHEK2, PALB2, PTEN, and TP53.  Results of pan-cancer panel are pending.    02/06/2021 Genetic Testing   Negative hereditary cancer genetic testing: no pathogenic variants detected in [facility] BRCAPlus Panel or CancerNext-Expanded +RNAinsight Panel.  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  The report dates are January 20, 2021 and February 06, 2021, respectively.   The BRCAplus panel offered by [employer] and includes sequencing and deletion/duplication analysis for the following 8 genes: ATM, BRCA1, BRCA2, CDH1, CHEK2, PALB2, PTEN, and TP53.  The CancerNext-Expanded gene panel offered by [facility] and includes sequencing, rearrangement, and RNA analysis for the following 77 genes: AIP, ALK, APC, ATM, AXIN2, BAP1, BARD1, BLM, BMPR1A, BRCA1, BRCA2, BRIP1, CDC73, CDH1, CDK4, CDKN1B, CDKN2A, CHEK2, CTNNA1, DICER1, FANCC, FH, FLCN, GALNT12, KIF1B, LZTR1, MAX, MEN1, MET, MLH1, MSH2, MSH3, MSH6, MUTYH, NBN, NF1, NF2, NTHL1, PALB2, PHOX2B, PMS2, POT1, PRKAR1A, PTCH1, PTEN, RAD51C, RAD51D, RB1, RECQL, RET, SDHA, SDHAF2, SDHB, SDHC, SDHD, SMAD4, SMARCA4, SMARCB1, SMARCE1, STK11, SUFU, TMEM127, TP53, TSC1, TSC2, VHL and XRCC2 (sequencing and deletion/duplication); EGFR, EGLN1, HOXB13, KIT, MITF, PDGFRA, POLD1, and POLE (sequencing only); EPCAM and GREM1 (deletion/duplication only).      CHIEF COMPLIANT: Surveillance of breast cancer  HISTORY OF PRESENT ILLNESS:   History of Present Illness   Renee [name] , a patient with a history of breast and thyroid  cancer, presents with concerns about weight gain, brain fog, and anxiety. She reports that she has not had a menstrual period in seven months, suggesting the onset of menopause. [name]  describes her weight gain as significant, despite not making any drastic changes to her diet. She expresses frustration with this issue, noting that it has been an uphill battle. [name]  also reports experiencing brain fog, which she describes as next level. She is concerned about this symptom, but the doctor reassures her that it is not a symptom of breast cancer recurrence. [name]  also mentions that she is experiencing anxiety, for which she is currently taking Paxil . She expresses concern about the potential side effects of this medication, including vision blurriness and a general feeling of dullness or cloudiness in her mind.  ALLERGIES:  is allergic to  erythromycin.  MEDICATIONS:  Current Outpatient Medications  Medication Sig Dispense Refill   ADDERALL XR 10 MG 24 hr capsule Take 5 mg by mouth every morning. (Patient not taking: Reported on 02/17/2023)  0   Ascorbic Acid (VITAMIN C) 1000 MG tablet Take 1,000 mg by mouth in the morning, at noon, and at bedtime. Taking 9000mg      buPROPion ER (WELLBUTRIN SR) 100 MG 12 hr tablet Take 100 mg by mouth every morning.     levothyroxine  (SYNTHROID , LEVOTHROID) 125 MCG tablet Take 1 tablet by mouth See admin instructions. Sun, Wed, Fri  3   levothyroxine  (SYNTHROID , LEVOTHROID) 137 MCG tablet Take 1 tablet by mouth See admin instructions. Mon, Tues, Thur, Sat  5   omeprazole (PRILOSEC) 40 MG capsule Take 40 mg by mouth daily.     PARoxetine  (PAXIL ) 10 MG tablet TAKE 1 TABLET BY MOUTH ONCE DAILY IN THE MORNING 30 tablet 0   Rhodiola 300 MG CAPS Take 1 capsule (300 mg total) by mouth daily.     thiamine (VITAMIN B-1) 100 MG tablet Take 100 mg by mouth daily.     VITAMIN E PO Take 500 Units by mouth daily.     Zinc  25 MG TABS Take 25 mg by mouth daily.     No current facility-administered medications for this visit.    PHYSICAL EXAMINATION: ECOG PERFORMANCE STATUS: 1 - Symptomatic but completely ambulatory  Vitals:   06/16/23 1021  BP: (!) 108/51  Pulse: 77  Resp: 17  Temp: 97.7 F (36.5 C)  SpO2: 99%   Filed Weights   06/16/23 1021  Weight: 205 lb 5 oz (93.1 kg)    Physical Exam   BREAST: No lumps.      (exam performed in the presence of a chaperone)  LABORATORY DATA:  I have reviewed the data as listed    Latest Ref Rng & Units 08/31/2021    9:32 AM 02/13/2021    3:16 AM 01/14/2021    8:16 AM  CMP  Glucose 70 - 99 mg/dL 894  865  95   BUN 6 - 20 mg/dL 10  6  12    Creatinine 0.44 - 1.00 mg/dL 9.35  9.38  9.21   Sodium 135 - 145 mmol/L 137  135  136   Potassium 3.5 - 5.1 mmol/L 3.9  3.7  4.2   Chloride 98 - 111 mmol/L 104  102  104   CO2 22 - 32 mmol/L 28  25  23    Calcium  8.9 - 10.3 mg/dL 9.0  8.7  9.5   Total Protein 6.5 - 8.1 g/dL 6.5   7.0   Total Bilirubin 0.3 - 1.2 mg/dL 0.9   0.4   Alkaline Phos 38 - 126 U/L 82   72   AST 15 - 41 U/L 13   16   ALT 0 - 44 U/L 14   21     Lab Results  Component Value Date   WBC 7.8 08/31/2021   HGB 13.7 08/31/2021   HCT 40.3 08/31/2021   MCV 90.6 08/31/2021   PLT 222 08/31/2021   NEUTROABS 5.7 08/31/2021    ASSESSMENT & PLAN:  Malignant neoplasm of upper-outer quadrant of left breast in female, estrogen receptor positive (HCC) 01/05/2021: T2N0 stage Ib grade 2 IDC ER/PR positive HER2 negative Ki-67 20% 01/20/2021: Genetics: Negative MammaPrint: High risk 02/12/2021: Bilateral mastectomies: Right: Benign, left T2 N0 stage Ib grade 2 IDC focal  positive anterior and superior margin 02/26/2021: Patient refused chemotherapy and radiation and antiestrogen therapy 06/09/2021: Bilateral breast reconstruction surgery (Dr. Arelia)   Breast cancer surveillance: 1.  Breast exam 06/16/2023: Benign 2. no role of imaging studies since she had bilateral mastectomies 3.  Recommended guardant reveal for MRD monitoring.        Post-Mastectomy Pain Syndrome Reports of persistent tightness on the sides. No new lumps or discomfort noted on physical examination. -Continue current management.  Menopause Reports of weight gain and absence of menstruation for seven months. Discussed the potential benefits of weight loss injections (Semaglutide) in reducing breast cancer risk and improving overall health. -Consider Semaglutide for weight management.  Breast Cancer Surveillance Discussed the benefits of circulating tumor DNA testing for early detection of potential recurrence. -Initiate circulating tumor DNA testing every six months.  Anxiety Reports of high anxiety, currently managed with Paxil  and a small dose of Adderall. Experiencing some side effects including brain fog and potential vision blurriness. -Continue current  management. Consider discussing alternative anxiety management strategies with primary care provider or psychiatrist.  Follow-up in 1 year         No orders of the defined types were placed in this encounter.  The patient has a good understanding of the overall plan. she agrees with it. she will call with any problems that may develop before the next visit here. Total time spent: 30 mins including face to face time and time spent for planning, charting and co-ordination of care   Naomi MARLA Chad, MD 06/16/23

## 2023-06-16 NOTE — Assessment & Plan Note (Signed)
 01/05/2021: T2N0 stage Ib grade 2 IDC ER/PR positive HER2 negative Ki-67 20% 01/20/2021: Genetics: Negative MammaPrint: High risk 02/12/2021: Bilateral mastectomies: Right: Benign, left T2 N0 stage Ib grade 2 IDC focal positive anterior and superior margin 02/26/2021: Patient refused chemotherapy and radiation and antiestrogen therapy 06/09/2021: Bilateral breast reconstruction surgery (Dr. Arelia)   Breast cancer surveillance: 1.  Breast exam 06/16/2023: Benign 2. no role of imaging studies since she had bilateral mastectomies   Tamoxifen: Patient is not interested in antiestrogen therapy. She is going to [place] in March for a family vacation. She is eating a plant-based diet and taking supplements to manage her hormones. Return to clinic in 1 year for follow-up

## 2023-06-22 ENCOUNTER — Telehealth: Payer: Self-pay

## 2023-06-22 NOTE — Telephone Encounter (Signed)
 Per md orders entered for [facility]al and all supported documents faxed to [phone]. Faxed confirmation was received.

## 2023-06-27 DIAGNOSIS — D2261 Melanocytic nevi of right upper limb, including shoulder: Secondary | ICD-10-CM | POA: Diagnosis not present

## 2023-06-27 DIAGNOSIS — D225 Melanocytic nevi of trunk: Secondary | ICD-10-CM | POA: Diagnosis not present

## 2023-06-27 DIAGNOSIS — D2262 Melanocytic nevi of left upper limb, including shoulder: Secondary | ICD-10-CM | POA: Diagnosis not present

## 2023-06-27 DIAGNOSIS — L821 Other seborrheic keratosis: Secondary | ICD-10-CM | POA: Diagnosis not present

## 2023-07-06 DIAGNOSIS — Z9013 Acquired absence of bilateral breasts and nipples: Secondary | ICD-10-CM | POA: Diagnosis not present

## 2023-07-06 DIAGNOSIS — Z853 Personal history of malignant neoplasm of breast: Secondary | ICD-10-CM | POA: Diagnosis not present

## 2023-07-22 DIAGNOSIS — Z170 Estrogen receptor positive status [ER+]: Secondary | ICD-10-CM | POA: Diagnosis not present

## 2023-07-22 DIAGNOSIS — C50412 Malignant neoplasm of upper-outer quadrant of left female breast: Secondary | ICD-10-CM | POA: Diagnosis not present

## 2023-07-27 ENCOUNTER — Telehealth: Payer: Self-pay

## 2023-07-27 NOTE — Telephone Encounter (Signed)
Called pt per MD to advise Guardant testing was negative/not detected. Pt verbalized understanding of results and knows [facility] will be in touch to schedule 6 mo repeat lab.

## 2023-07-28 ENCOUNTER — Encounter: Payer: Self-pay | Admitting: Hematology and Oncology

## 2023-08-12 ENCOUNTER — Encounter: Payer: Self-pay | Admitting: Hematology and Oncology

## 2023-08-19 DIAGNOSIS — M722 Plantar fascial fibromatosis: Secondary | ICD-10-CM | POA: Diagnosis not present

## 2023-10-13 DIAGNOSIS — T7840XA Allergy, unspecified, initial encounter: Secondary | ICD-10-CM | POA: Diagnosis not present

## 2023-10-13 DIAGNOSIS — R21 Rash and other nonspecific skin eruption: Secondary | ICD-10-CM | POA: Diagnosis not present

## 2023-10-13 DIAGNOSIS — G43909 Migraine, unspecified, not intractable, without status migrainosus: Secondary | ICD-10-CM | POA: Diagnosis not present

## 2023-10-13 DIAGNOSIS — J029 Acute pharyngitis, unspecified: Secondary | ICD-10-CM | POA: Diagnosis not present

## 2023-11-07 ENCOUNTER — Telehealth (HOSPITAL_BASED_OUTPATIENT_CLINIC_OR_DEPARTMENT_OTHER): Payer: Self-pay

## 2023-11-07 NOTE — Telephone Encounter (Signed)
 Patient called today to let us  know that she is going to her PCP soon for a physical and blood work. She states that she has been bothered with exhaustion and fatigue and would like for Dr. Annabell Key to let her know if there is any extra blood work that could or needs to be drawn at that visit. I tried encouraging the patient to speak with her PCP about the symptoms that she was having, however she wanted to know Dr. Kassie Pais opinion.

## 2023-11-09 NOTE — Telephone Encounter (Signed)
 LMOM at 10:44 for patient to give our office a call back. Would like to relay message per Dr. Annabell Key in reference to additional blood work for PCP to draw at next office visit.

## 2023-11-10 NOTE — Telephone Encounter (Signed)
 Called and spoke with patient. Advised patient of recommendations per Dr. Annabell Key. She will suggest this blood work to her PCP. Her appointment is tomorrow. tbw

## 2023-11-11 DIAGNOSIS — Z1212 Encounter for screening for malignant neoplasm of rectum: Secondary | ICD-10-CM | POA: Diagnosis not present

## 2023-11-11 DIAGNOSIS — N951 Menopausal and female climacteric states: Secondary | ICD-10-CM | POA: Diagnosis not present

## 2023-11-11 DIAGNOSIS — E039 Hypothyroidism, unspecified: Secondary | ICD-10-CM | POA: Diagnosis not present

## 2023-11-18 DIAGNOSIS — Z1331 Encounter for screening for depression: Secondary | ICD-10-CM | POA: Diagnosis not present

## 2023-11-18 DIAGNOSIS — Z0000 Encounter for general adult medical examination without abnormal findings: Secondary | ICD-10-CM | POA: Diagnosis not present

## 2023-11-18 DIAGNOSIS — R82998 Other abnormal findings in urine: Secondary | ICD-10-CM | POA: Diagnosis not present

## 2023-11-18 DIAGNOSIS — E039 Hypothyroidism, unspecified: Secondary | ICD-10-CM | POA: Diagnosis not present

## 2024-03-09 ENCOUNTER — Ambulatory Visit (HOSPITAL_BASED_OUTPATIENT_CLINIC_OR_DEPARTMENT_OTHER): Admitting: Obstetrics & Gynecology

## 2024-04-06 DIAGNOSIS — M7732 Calcaneal spur, left foot: Secondary | ICD-10-CM | POA: Diagnosis not present

## 2024-04-06 DIAGNOSIS — M722 Plantar fascial fibromatosis: Secondary | ICD-10-CM | POA: Diagnosis not present

## 2024-04-06 DIAGNOSIS — G5761 Lesion of plantar nerve, right lower limb: Secondary | ICD-10-CM | POA: Diagnosis not present

## 2024-04-06 DIAGNOSIS — M779 Enthesopathy, unspecified: Secondary | ICD-10-CM | POA: Diagnosis not present

## 2024-04-06 DIAGNOSIS — M2011 Hallux valgus (acquired), right foot: Secondary | ICD-10-CM | POA: Diagnosis not present

## 2024-04-06 DIAGNOSIS — M7731 Calcaneal spur, right foot: Secondary | ICD-10-CM | POA: Diagnosis not present

## 2024-04-09 ENCOUNTER — Encounter (HOSPITAL_BASED_OUTPATIENT_CLINIC_OR_DEPARTMENT_OTHER): Payer: Self-pay | Admitting: Certified Nurse Midwife

## 2024-04-09 ENCOUNTER — Other Ambulatory Visit (HOSPITAL_COMMUNITY)
Admission: RE | Admit: 2024-04-09 | Discharge: 2024-04-09 | Disposition: A | Discharge status: Home / Self Care | Source: Ambulatory Visit | Attending: Certified Nurse Midwife | Admitting: Certified Nurse Midwife

## 2024-04-09 ENCOUNTER — Ambulatory Visit (INDEPENDENT_AMBULATORY_CARE_PROVIDER_SITE_OTHER): Admitting: Certified Nurse Midwife

## 2024-04-09 VITALS — BP 99/34 | HR 78 | Ht 68.5 in | Wt 196.2 lb

## 2024-04-09 DIAGNOSIS — Z124 Encounter for screening for malignant neoplasm of cervix: Secondary | ICD-10-CM

## 2024-04-09 DIAGNOSIS — C50911 Malignant neoplasm of unspecified site of right female breast: Secondary | ICD-10-CM

## 2024-04-09 DIAGNOSIS — Z01419 Encounter for gynecological examination (general) (routine) without abnormal findings: Secondary | ICD-10-CM | POA: Diagnosis not present

## 2024-04-09 DIAGNOSIS — Z170 Estrogen receptor positive status [ER+]: Secondary | ICD-10-CM | POA: Diagnosis not present

## 2024-04-09 DIAGNOSIS — D1801 Hemangioma of skin and subcutaneous tissue: Secondary | ICD-10-CM | POA: Diagnosis not present

## 2024-04-09 DIAGNOSIS — E890 Postprocedural hypothyroidism: Secondary | ICD-10-CM

## 2024-04-09 DIAGNOSIS — Z1331 Encounter for screening for depression: Secondary | ICD-10-CM

## 2024-04-09 DIAGNOSIS — L820 Inflamed seborrheic keratosis: Secondary | ICD-10-CM | POA: Diagnosis not present

## 2024-04-09 DIAGNOSIS — C50412 Malignant neoplasm of upper-outer quadrant of left female breast: Secondary | ICD-10-CM | POA: Diagnosis not present

## 2024-04-09 DIAGNOSIS — L578 Other skin changes due to chronic exposure to nonionizing radiation: Secondary | ICD-10-CM | POA: Diagnosis not present

## 2024-04-09 DIAGNOSIS — D485 Neoplasm of uncertain behavior of skin: Secondary | ICD-10-CM | POA: Diagnosis not present

## 2024-04-09 DIAGNOSIS — L570 Actinic keratosis: Secondary | ICD-10-CM | POA: Diagnosis not present

## 2024-04-09 DIAGNOSIS — Z9013 Acquired absence of bilateral breasts and nipples: Secondary | ICD-10-CM

## 2024-04-09 DIAGNOSIS — S70362A Insect bite (nonvenomous), left thigh, initial encounter: Secondary | ICD-10-CM | POA: Diagnosis not present

## 2024-04-10 ENCOUNTER — Ambulatory Visit (HOSPITAL_BASED_OUTPATIENT_CLINIC_OR_DEPARTMENT_OTHER): Payer: Self-pay | Admitting: Certified Nurse Midwife

## 2024-04-10 LAB — THYROID PANEL WITH TSH
Free Thyroxine Index: 2.7 (ref 1.2–4.9)
T3 Uptake Ratio: 30 % (ref 24–39)
T4, Total: 9 ug/dL (ref 4.5–12.0)
TSH: 3.44 u[IU]/mL (ref 0.450–4.500)

## 2024-04-10 LAB — CYTOLOGY - PAP
Comment: NEGATIVE
Diagnosis: NEGATIVE
High risk HPV: NEGATIVE

## 2024-04-10 LAB — VITAMIN D 25 HYDROXY (VIT D DEFICIENCY, FRACTURES): Vit D, 25-Hydroxy: 107 ng/mL — ABNORMAL HIGH (ref 30.0–100.0)

## 2024-04-10 MED ORDER — VITAMIN D (ERGOCALCIFEROL) 1.25 MG (50000 UNIT) PO CAPS: 50000.0000 [IU] | ORAL_CAPSULE | ORAL | 3 refills | Status: AC

## 2024-04-10 NOTE — Progress Notes (Signed)
 53 y.o. G1P1 Married White or Caucasian Post-Menopausal female here for annual exam. Pt has Hx of Breast Cancer and Thyroid  Cancer. Pt does experience hot flashes, night sweats, difficulty sleeping, anxiety. Not a candidate for estrogen therapy due to Hx Breast Cancer. She is postmenopausal and denies vaginal spotting or bleeding. Does experience some vaginal dryness.   No LMP recorded.           Exercising: Yes.     Smoker:  no  Health Maintenance: Pap:  Collected History of abnormal Pap:  no MMG:  Hx Breast Cancer/Bilateral Mastectomy with Reconstruction  Colonoscopy:  UTD   reports that she has never smoked. She has never used smokeless tobacco. She reports current alcohol  use of about 1.0 - 2.0 standard drink of alcohol  per week. She reports that she does not use drugs.  Past Medical History:  Diagnosis Date   Anxiety    Arthritis    hands and hips   Breast cancer (HCC)    Cancer (HCC) 2013 Thyroid    Depression    Endometrial polyp 01/14/2016   Family history of breast cancer 01/14/2021   Fibroid, uterine    History of kidney stones    History of thyroid  cancer 01/14/2021   Infertility, female    PONV (postoperative nausea and vomiting) 2013   Thyroid  cancer (HCC) 2013   Urinary incontinence     Past Surgical History:  Procedure Laterality Date   BREAST RECONSTRUCTION WITH PLACEMENT OF TISSUE EXPANDER AND ALLODERM Bilateral 02/12/2021   Procedure: BILATERAL BREAST RECONSTRUCTION WITH PLACEMENT OF TISSUE EXPANDER AND ALLODERM;  Surgeon: Arelia Filippo, MD;  Location: [facility] OR;  Service: Plastics;  Laterality: Bilateral;   HYSTEROSCOPY WITH D & C  10/13/2017   polyp resection   MASTECTOMY W/ SENTINEL NODE BIOPSY Left 02/12/2021   Procedure: LEFT MASTECTOMY WITH SENTINEL LYMPH NODE BIOPSY;  Surgeon: Belinda Cough, MD;  Location: [facility] OR;  Service: General;  Laterality: Left;   REMOVAL OF BILATERAL TISSUE EXPANDERS WITH PLACEMENT OF BILATERAL BREAST IMPLANTS Bilateral 06/09/2021    Procedure: REMOVAL OF BILATERAL TISSUE EXPANDERS WITH PLACEMENT OF BILATERAL BREAST IMPLANTS;  Surgeon: Arelia Filippo, MD;  Location: [facility];  Service: Plastics;  Laterality: Bilateral;   SIMPLE MASTECTOMY WITH AXILLARY SENTINEL NODE BIOPSY Right 02/12/2021   Procedure: RIGHT PROPHYLACTIC MASTECTOMY;  Surgeon: Belinda Cough, MD;  Location: [facility];  Service: General;  Laterality: Right;   TOTAL THYROIDECTOMY  2013   WISDOM TOOTH EXTRACTION      Current Outpatient Medications  Medication Sig Dispense Refill   Ascorbic Acid (VITAMIN C) 1000 MG tablet Take 1,000 mg by mouth in the morning, at noon, and at bedtime. Taking 9000mg      buPROPion ER (WELLBUTRIN SR) 100 MG 12 hr tablet Take 100 mg by mouth every morning.     levothyroxine  (SYNTHROID , LEVOTHROID) 125 MCG tablet Take 1 tablet by mouth See admin instructions. Sun, Wed, Fri  3   levothyroxine  (SYNTHROID , LEVOTHROID) 137 MCG tablet Take 1 tablet by mouth See admin instructions. Mon, Tues, Thur, Sat  5   omeprazole (PRILOSEC) 40 MG capsule Take 40 mg by mouth daily.     PARoxetine  (PAXIL ) 10 MG tablet TAKE 1 TABLET BY MOUTH ONCE DAILY IN THE MORNING 30 tablet 0   Rhodiola 300 MG CAPS Take 1 capsule (300 mg total) by mouth daily.     thiamine (VITAMIN B-1) 100 MG tablet Take 100 mg by mouth daily.     VITAMIN E PO Take 500  Units by mouth daily.     Zinc  25 MG TABS Take 25 mg by mouth daily.     ADDERALL XR 10 MG 24 hr capsule Take 5 mg by mouth every morning. (Patient not taking: Reported on 04/09/2024)  0   Vitamin D, Ergocalciferol, (DRISDOL) 1.25 MG (50000 UNIT) CAPS capsule Take 1 capsule (50,000 Units total) by mouth every 7 (seven) days. 12 capsule 3   No current facility-administered medications for this visit.    Family History  Problem Relation Age of Onset   Breast cancer Mother 70       bilateral mastectomy   Cancer Mother    Vision loss Mother    Diabetes Father    Skin cancer Sister        basal cell  carcinoma; head/face   Skin cancer Maternal Grandfather        nose; dx after 73   Breast cancer Other        maternal great aunt x2; dx before 50    ROS: Constitutional: negative Genitourinary:negative  Exam:   BP (!) 99/34   Pulse 78   Ht 5' 8.5 (1.74 m)   Wt 196 lb 3.2 oz (89 kg)   BMI 29.40 kg/m   Height: 5' 8.5 (174 cm)  General appearance: alert, cooperative and appears stated age Head: Normocephalic, without obvious abnormality, atraumatic Lungs: clear to auscultation bilaterally Breasts: Hx Bilateral Mastectomy w/ Breast Reduction. No palpable abnormalities. Scar present right breast 12:00 (from port placement previously). Heart: regular rate and rhythm Abdomen: soft, non-tender; bowel sounds normal; no masses,  no organomegaly Extremities: extremities normal, atraumatic, no cyanosis or edema Skin: Skin color, texture, turgor normal. No rashes or lesions Lymph nodes: Cervical, supraclavicular, and axillary nodes normal. No abnormal inguinal nodes palpated Neurologic: Grossly normal   Pelvic: External genitalia:  no lesions              Urethra:  normal appearing urethra with no masses, tenderness or lesions              Bartholins and Skenes: normal                 Vagina: normal appearing vagina with normal color and no discharge, no lesions              Cervix: no bleeding following Pap, no cervical motion tenderness, and no lesions              Pap taken: Yes.   Bimanual Exam:  Uterus:  normal size, contour, position, consistency, mobility, non-tender              Adnexa: no mass, fullness, tenderness               Rectovaginal: Confirms               Anus:  normal sphincter tone, no lesions  Chaperone,  CMA, was present for exam.  Assessment/Plan:   1. Encounter for annual routine gynecological examination (Primary) - Thyroid  Panel With TSH - VITAMIN D 25 Hydroxy (Vit-D Deficiency, Fractures) - Routine pap smear collected for cervical cancer  screening  2. Cervical cancer screening - Cytology - PAP( [place])  3. Post-surgical hypothyroidism - TSH pending  4. Malignant neoplasm of upper-outer quadrant of left breast in female, estrogen receptor positive (HCC)   5. Invasive ductal carcinoma of breast, female, right (HCC)   6. History of bilateral mastectomy  Renee [name]

## 2024-04-12 DIAGNOSIS — F4322 Adjustment disorder with anxiety: Secondary | ICD-10-CM | POA: Diagnosis not present

## 2024-04-19 DIAGNOSIS — F4322 Adjustment disorder with anxiety: Secondary | ICD-10-CM | POA: Diagnosis not present

## 2024-04-24 ENCOUNTER — Ambulatory Visit (HOSPITAL_BASED_OUTPATIENT_CLINIC_OR_DEPARTMENT_OTHER): Admitting: Obstetrics & Gynecology

## 2024-06-21 ENCOUNTER — Inpatient Hospital Stay: Payer: BC Managed Care – PPO | Attending: Hematology and Oncology | Admitting: Hematology and Oncology

## 2024-06-21 ENCOUNTER — Encounter: Payer: Self-pay | Admitting: *Deleted

## 2024-06-21 DIAGNOSIS — Z170 Estrogen receptor positive status [ER+]: Secondary | ICD-10-CM | POA: Diagnosis not present

## 2024-06-21 DIAGNOSIS — C50412 Malignant neoplasm of upper-outer quadrant of left female breast: Secondary | ICD-10-CM | POA: Diagnosis not present

## 2024-06-21 DIAGNOSIS — Z9013 Acquired absence of bilateral breasts and nipples: Secondary | ICD-10-CM | POA: Insufficient documentation

## 2024-06-21 DIAGNOSIS — Z853 Personal history of malignant neoplasm of breast: Secondary | ICD-10-CM | POA: Insufficient documentation

## 2024-06-21 DIAGNOSIS — Z79899 Other long term (current) drug therapy: Secondary | ICD-10-CM | POA: Insufficient documentation

## 2024-06-21 NOTE — Assessment & Plan Note (Addendum)
 01/05/2021: T2N0 stage Ib grade 2 IDC ER/PR positive HER2 negative Ki-67 20% 01/20/2021: Genetics: Negative MammaPrint: High risk 02/12/2021: Bilateral mastectomies: Right: Benign, left T2 N0 stage Ib grade 2 IDC focal positive anterior and superior margin 02/26/2021: Patient refused chemotherapy and radiation and antiestrogen therapy 06/09/2021: Bilateral breast reconstruction surgery (Dr. Arelia)   Breast cancer surveillance: 1.  Breast exam 06/21/2024: Benign 2. no role of imaging studies since she had bilateral mastectomies 3.  Guardant reveal 07/15/2023: 0.     Post-Mastectomy Pain Syndrome

## 2024-06-21 NOTE — Progress Notes (Signed)
 "  Patient Care Team: Onita Rush, MD as PCP - General (Internal Medicine) Tyree Nanetta SAILOR, RN as Oncology Nurse Navigator Belinda Cough, MD as Consulting Physician (General Surgery) Izell Domino, MD as Attending Physician (Radiation Oncology) Hollar, Lahoma Greener, MD as Referring Physician (Dermatology) Arelia Filippo, MD as Consulting Physician (Plastic Surgery)  DIAGNOSIS:  Encounter Diagnosis  Name Primary?   Malignant neoplasm of upper-outer quadrant of left breast in female, estrogen receptor positive (HCC) Yes    SUMMARY OF ONCOLOGIC HISTORY: Oncology History  Malignant neoplasm of upper-outer quadrant of left breast in female, estrogen receptor positive (HCC)  01/08/2021 Initial Diagnosis   Malignant neoplasm of upper-outer quadrant of left breast in female, estrogen receptor positive (HCC)   01/14/2021 Cancer Staging   Staging form: Breast, AJCC 8th Edition - Clinical: Stage IB (cT2, cN0, cM0, G2, ER+, PR+, HER2-) - Signed by Layla Sandria BROCKS, MD on 01/14/2021 Histologic grading system: 3 grade system   01/20/2021 Genetic Testing   Negative hereditary cancer genetic testing: no pathogenic variants detected in [facility] Panel.  The report date is January 20, 2021.   The BRCAplus panel offered by [employer] and includes sequencing and deletion/duplication analysis for the following 8 genes: ATM, BRCA1, BRCA2, CDH1, CHEK2, PALB2, PTEN, and TP53.  Results of pan-cancer panel are pending.    02/06/2021 Genetic Testing   Negative hereditary cancer genetic testing: no pathogenic variants detected in [facility] BR[facility] Panel or CancerNext-Expanded +RNAinsight Panel.  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  The report dates are January 20, 2021 and February 06, 2021, respectively.   The BRCAplus panel offered by [employer] and includes sequencing and deletion/duplication analysis for the following 8 genes: ATM, BRCA1, BRCA2, CDH1,  CHEK2, PALB2, PTEN, and TP53.  The CancerNext-Expanded gene panel offered by [facility] and includes sequencing, rearrangement, and RNA analysis for the following 77 genes: AIP, ALK, APC, ATM, AXIN2, BAP1, BARD1, BLM, BMPR1A, BRCA1, BRCA2, BRIP1, CDC73, CDH1, CDK4, CDKN1B, CDKN2A, CHEK2, CTNNA1, DICER1, FANCC, FH, FLCN, GALNT12, KIF1B, LZTR1, MAX, MEN1, MET, MLH1, MSH2, MSH3, MSH6, MUTYH, NBN, NF1, NF2, NTHL1, PALB2, PHOX2B, PMS2, POT1, PRKAR1A, PTCH1, PTEN, RAD51C, RAD51D, RB1, RECQL, RET, SDHA, SDHAF2, SDHB, SDHC, SDHD, SMAD4, SMARCA4, SMARCB1, SMARCE1, STK11, SUFU, TMEM127, TP53, TSC1, TSC2, VHL and XRCC2 (sequencing and deletion/duplication); EGFR, EGLN1, HOXB13, KIT, MITF, PDGFRA, POLD1, and POLE (sequencing only); EPCAM and GREM1 (deletion/duplication only).      CHIEF COMPLIANT: Surveillance of breast cancer  HISTORY OF PRESENT ILLNESS:   History of Present Illness Renee  H [name] is a 54 year old woman with estrogen receptor positive, stage IB left breast cancer in remission who presents for routine oncology surveillance.  She is about three and a half years from diagnosis of left invasive ductal carcinoma treated with bilateral mastectomies and reconstruction in September 2022. She declined adjuvant chemotherapy, radiation, and antiestrogen therapy. She opts for annual ctDNA testing for minimal residual disease surveillance and remains anxious about the testing but wishes to continue. The most recent ctDNA was done in February of the previous year. She is asymptomatic, with no chest pain or other symptoms concerning for recurrence.  She describes a chronic abnormality at the left breast implant site with a visible dip and odd sensation, with intermittent mild soreness but no persistent pain. It has been stable and does not change with weight. She is planning follow-up with her breast surgeon and believes this relates to the implant.  She has lost weight from over 200 lb to  186 lb over  the past year while on compounded tirzepatide, with mild nausea but improved mobility and mental well-being. She is focusing on higher protein and lower carbohydrate intake and is concerned about maintaining weight loss if tirzepatide is stopped.  She is postmenopausal since June 2024 with ongoing vasomotor symptoms including night sweats and sleep disturbance. She finds weight gain more difficult since menopause and feels pharmacotherapy has been necessary for weight loss.       ALLERGIES:  is allergic to amoxicillin-pot clavulanate, penicillins, and erythromycin.  MEDICATIONS:  Current Outpatient Medications  Medication Sig Dispense Refill   ADDERALL XR 10 MG 24 hr capsule Take 5 mg by mouth every morning.  0   Ascorbic Acid (VITAMIN C) 1000 MG tablet Take 1,000 mg by mouth in the morning, at noon, and at bedtime. Taking 9000mg      buPROPion ER (WELLBUTRIN SR) 100 MG 12 hr tablet Take 100 mg by mouth every morning.     levothyroxine  (SYNTHROID , LEVOTHROID) 125 MCG tablet Take 1 tablet by mouth See admin instructions. Sun, Wed, Fri  3   levothyroxine  (SYNTHROID , LEVOTHROID) 137 MCG tablet Take 1 tablet by mouth See admin instructions. Mon, Tues, Thur, Sat  5   omeprazole (PRILOSEC) 40 MG capsule Take 40 mg by mouth daily.     PARoxetine  (PAXIL ) 10 MG tablet TAKE 1 TABLET BY MOUTH ONCE DAILY IN THE MORNING 30 tablet 0   Rhodiola 300 MG CAPS Take 1 capsule (300 mg total) by mouth daily.     thiamine (VITAMIN B-1) 100 MG tablet Take 100 mg by mouth daily.     tirzepatide (ZEPBOUND) 2.5 MG/0.5ML Pen Inject 2.5 mg into the skin once a week.     Vitamin D , Ergocalciferol , (DRISDOL ) 1.25 MG (50000 UNIT) CAPS capsule Take 1 capsule (50,000 Units total) by mouth every 7 (seven) days. 12 capsule 3   VITAMIN E PO Take 500 Units by mouth daily.     Zinc  25 MG TABS Take 25 mg by mouth daily.     No current facility-administered medications for this visit.    PHYSICAL EXAMINATION: ECOG PERFORMANCE  STATUS: 1 - Symptomatic but completely ambulatory  There were no vitals filed for this visit. There were no vitals filed for this visit.  Physical Exam MEASUREMENTS: Weight- 186. BREAST: Implant area with a dip, feels strange, some soreness  (exam performed in the presence of a chaperone)  LABORATORY DATA:  I have reviewed the data as listed    Latest Ref Rng & Units 08/31/2021    9:32 AM 02/13/2021    3:16 AM 01/14/2021    8:16 AM  CMP  Glucose 70 - 99 mg/dL 894  865  95   BUN 6 - 20 mg/dL 10  6  12    Creatinine 0.44 - 1.00 mg/dL 9.35  9.38  9.21   Sodium 135 - 145 mmol/L 137  135  136   Potassium 3.5 - 5.1 mmol/L 3.9  3.7  4.2   Chloride 98 - 111 mmol/L 104  102  104   CO2 22 - 32 mmol/L 28  25  23    Calcium 8.9 - 10.3 mg/dL 9.0  8.7  9.5   Total Protein 6.5 - 8.1 g/dL 6.5   7.0   Total Bilirubin 0.3 - 1.2 mg/dL 0.9   0.4   Alkaline Phos 38 - 126 U/L 82   72   AST 15 - 41 U/L 13   16   ALT 0 -  44 U/L 14   21     Lab Results  Component Value Date   WBC 7.8 08/31/2021   HGB 13.7 08/31/2021   HCT 40.3 08/31/2021   MCV 90.6 08/31/2021   PLT 222 08/31/2021   NEUTROABS 5.7 08/31/2021    ASSESSMENT & PLAN:  Malignant neoplasm of upper-outer quadrant of left breast in female, estrogen receptor positive (HCC) 01/05/2021: T2N0 stage Ib grade 2 IDC ER/PR positive HER2 negative Ki-67 20% 01/20/2021: Genetics: Negative MammaPrint: High risk 02/12/2021: Bilateral mastectomies: Right: Benign, left T2 N0 stage Ib grade 2 IDC focal positive anterior and superior margin 02/26/2021: Patient refused chemotherapy and radiation and antiestrogen therapy 06/09/2021: Bilateral breast reconstruction surgery (Dr. Arelia)   Breast cancer surveillance: 1.  Breast exam 06/21/2024: Benign 2. no role of imaging studies since she had bilateral mastectomies 3.  Guardant reveal 07/15/2023: 0.  (Patient wants to do this once a year)    Weight: Patient lost about 20 pounds on GLP-1 medication.  She feels  that she is less inflamed and feels significantly better. Post-Mastectomy Pain Syndrome Patient noted a palpable abnormality in the right breast reconstruction on the upper outer quadrant.  I sent a message to Dr. Arelia to see if she could evaluated. ------------------------------------- Assessment and Plan Assessment & Plan Estrogen receptor positive left breast cancer status post mastectomy Asymptomatic for recurrence but reports implant site abnormality. - Provided education on ctDNA testing, including sensitivity, investigational status, and insurance coverage limitations. - Ordered annual ctDNA blood test for surveillance. - Conditional plan: if ctDNA positive, proceed with whole body imaging and repeat ctDNA testing every three months. - Advised insurance typically covers imaging if indicated by abnormal results. - Encouraged weight management and healthy lifestyle. - Continue annual clinical follow-up and ctDNA testing every 12 months.  Post-mastectomy pain syndrome Mild soreness and persistent implant site abnormality with altered sensation. Symptoms are bothersome but do not interfere with daily activities. - Continue conservative management unless symptoms worsen.  Menopause with persistent symptoms Postmenopausal with ongoing vasomotor symptoms, sleep disturbance, and weight management difficulty. Symptoms are long-term effects post-cancer therapy and surgical menopause. - Provided reassurance regarding persistence of menopausal symptoms. - Discussed impact of menopause on weight management and health.       No orders of the defined types were placed in this encounter.  The patient has a good understanding of the overall plan. she agrees with it. she will call with any problems that may develop before the next visit here.  I personally spent a total of 30 minutes in the care of the patient today including preparing to see the patient, getting/reviewing separately  obtained history, performing a medically appropriate exam/evaluation, counseling and educating, placing orders, referring and communicating with other health care professionals, documenting clinical information in the EHR, independently interpreting results, communicating results, and coordinating care.   Viinay K Henrine Hayter, MD 06/21/24    "

## 2024-06-21 NOTE — Progress Notes (Signed)
 Pt requesting Guardant Reveal testing to be done once a year and not every 6 months.  Message sent to [facility] representative.

## 2025-06-27 ENCOUNTER — Inpatient Hospital Stay: Admitting: Hematology and Oncology
